# Patient Record
Sex: Male | Born: 1945 | Race: White | Hispanic: No | Marital: Married | Smoking: Former smoker
Health system: Southern US, Community
[De-identification: ages and names within clinical notes are randomized; demographics above are authoritative.]

## PROBLEM LIST (undated history)

## (undated) DIAGNOSIS — I219 Acute myocardial infarction, unspecified: Secondary | ICD-10-CM

## (undated) DIAGNOSIS — M1990 Unspecified osteoarthritis, unspecified site: Secondary | ICD-10-CM

## (undated) DIAGNOSIS — J189 Pneumonia, unspecified organism: Secondary | ICD-10-CM

## (undated) DIAGNOSIS — F419 Anxiety disorder, unspecified: Secondary | ICD-10-CM

## (undated) DIAGNOSIS — M810 Age-related osteoporosis without current pathological fracture: Secondary | ICD-10-CM

## (undated) DIAGNOSIS — K219 Gastro-esophageal reflux disease without esophagitis: Secondary | ICD-10-CM

## (undated) DIAGNOSIS — I10 Essential (primary) hypertension: Secondary | ICD-10-CM

## (undated) DIAGNOSIS — I2510 Atherosclerotic heart disease of native coronary artery without angina pectoris: Secondary | ICD-10-CM

## (undated) HISTORY — DX: Gastro-esophageal reflux disease without esophagitis: K21.9

## (undated) HISTORY — DX: Anxiety disorder, unspecified: F41.9

## (undated) HISTORY — PX: OTHER SURGICAL HISTORY: SHX169

## (undated) HISTORY — PX: CARDIAC SURGERY: SHX584

## (undated) HISTORY — PX: JOINT REPLACEMENT: SHX530

## (undated) HISTORY — PX: CARDIAC CATHETERIZATION: SHX172

## (undated) HISTORY — PX: HIP FRACTURE SURGERY: SHX118

## (undated) HISTORY — DX: Age-related osteoporosis without current pathological fracture: M81.0

---

## 2015-02-21 ENCOUNTER — Encounter (HOSPITAL_COMMUNITY): Payer: Self-pay | Admitting: Cardiology

## 2015-02-21 ENCOUNTER — Emergency Department (HOSPITAL_COMMUNITY)
Admission: EM | Admit: 2015-02-21 | Discharge: 2015-02-21 | Disposition: A | Discharge status: Home / Self Care | Payer: Medicare Other | Attending: Emergency Medicine | Admitting: Emergency Medicine

## 2015-02-21 ENCOUNTER — Emergency Department (HOSPITAL_COMMUNITY): Payer: Medicare Other

## 2015-02-21 DIAGNOSIS — R0789 Other chest pain: Secondary | ICD-10-CM | POA: Diagnosis not present

## 2015-02-21 DIAGNOSIS — R0602 Shortness of breath: Secondary | ICD-10-CM | POA: Diagnosis not present

## 2015-02-21 DIAGNOSIS — Z87891 Personal history of nicotine dependence: Secondary | ICD-10-CM | POA: Insufficient documentation

## 2015-02-21 DIAGNOSIS — R079 Chest pain, unspecified: Secondary | ICD-10-CM | POA: Diagnosis present

## 2015-02-21 DIAGNOSIS — Z79899 Other long term (current) drug therapy: Secondary | ICD-10-CM | POA: Insufficient documentation

## 2015-02-21 DIAGNOSIS — Z8739 Personal history of other diseases of the musculoskeletal system and connective tissue: Secondary | ICD-10-CM | POA: Diagnosis not present

## 2015-02-21 HISTORY — DX: Unspecified osteoarthritis, unspecified site: M19.90

## 2015-02-21 LAB — CBC
HCT: 43.4 % (ref 39.0–52.0)
HEMOGLOBIN: 15 g/dL (ref 13.0–17.0)
MCH: 30.5 pg (ref 26.0–34.0)
MCHC: 34.6 g/dL (ref 30.0–36.0)
MCV: 88.2 fL (ref 78.0–100.0)
Platelets: 201 10*3/uL (ref 150–400)
RBC: 4.92 MIL/uL (ref 4.22–5.81)
RDW: 13.2 % (ref 11.5–15.5)
WBC: 10.2 10*3/uL (ref 4.0–10.5)

## 2015-02-21 LAB — COMPREHENSIVE METABOLIC PANEL
ALBUMIN: 3.6 g/dL (ref 3.5–5.0)
ALK PHOS: 51 U/L (ref 38–126)
ALT: 18 U/L (ref 17–63)
AST: 18 U/L (ref 15–41)
Anion gap: 8 (ref 5–15)
BUN: 19 mg/dL (ref 6–20)
CALCIUM: 8.8 mg/dL — AB (ref 8.9–10.3)
CO2: 26 mmol/L (ref 22–32)
CREATININE: 1.12 mg/dL (ref 0.61–1.24)
Chloride: 105 mmol/L (ref 101–111)
GFR calc Af Amer: >60 mL/min (ref >60)
GFR calc non Af Amer: >60 mL/min (ref >60)
GLUCOSE: 132 mg/dL — AB (ref 65–99)
Potassium: 3.4 mmol/L — ABNORMAL LOW (ref 3.5–5.1)
SODIUM: 139 mmol/L (ref 135–145)
Total Bilirubin: 0.6 mg/dL (ref 0.3–1.2)
Total Protein: 6.8 g/dL (ref 6.5–8.1)

## 2015-02-21 LAB — I-STAT TROPONIN, ED: Troponin i, poc: 0 ng/mL (ref 0.00–0.08)

## 2015-02-21 LAB — PROTIME-INR
INR: 1.03 (ref 0.00–1.49)
Prothrombin Time: 13.7 seconds (ref 11.6–15.2)

## 2015-02-21 LAB — TROPONIN I: Troponin I: <0.03 ng/mL (ref <0.031)

## 2015-02-21 LAB — MAGNESIUM: Magnesium: 1.8 mg/dL (ref 1.7–2.4)

## 2015-02-21 MED ORDER — NITROGLYCERIN 0.4 MG SL SUBL: 0.4000 mg | SUBLINGUAL_TABLET | Freq: Once | SUBLINGUAL | Status: DC

## 2015-02-21 MED ORDER — PREDNISONE 50 MG PO TABS: 50.0000 mg | ORAL_TABLET | Freq: Every day | ORAL | Status: DC

## 2015-02-21 MED ORDER — NITROGLYCERIN 0.4 MG SL SUBL: 0.4000 mg | SUBLINGUAL_TABLET | SUBLINGUAL | Status: DC | PRN

## 2015-02-21 MED ORDER — ASPIRIN 81 MG PO CHEW
324.0000 mg | CHEWABLE_TABLET | Freq: Once | ORAL | Status: AC
  Administered 2015-02-21: 324 mg via ORAL
  Filled 2015-02-21: qty 4

## 2015-02-21 MED ORDER — NITROGLYCERIN 0.4 MG SL SUBL
0.4000 mg | SUBLINGUAL_TABLET | SUBLINGUAL | Status: DC | PRN
  Administered 2015-02-21 (×3): 0.4 mg via SUBLINGUAL

## 2015-02-21 NOTE — ED Notes (Signed)
No ASA in last 24 H.

## 2015-02-21 NOTE — ED Notes (Signed)
Pt reports pain has returned.

## 2015-02-21 NOTE — ED Notes (Signed)
Pt. returned from XR. 

## 2015-02-21 NOTE — ED Notes (Signed)
Pt report pain has subsided.

## 2015-02-21 NOTE — ED Notes (Signed)
Chest pain since last night.   

## 2015-02-21 NOTE — ED Provider Notes (Signed)
CSN: [id number]     Arrival date & time 02/21/15  1311 History   First MD Initiated Contact with Patient 02/21/15 1317     Chief Complaint  Patient presents with  . Chest Pain   HPI  Mr. Yabut is a 69yo male presenting today for left chest pain since last night. Reports pain is constant and describes as a dull ache. Pain not affected by touch, movement, or breathing. Pain was worse when sleeping no his right side last night. Denies palpitations, but states it feels like his heart is beating fast. Has also noted shortness of breath over the last two weeks. Quit smoking two weeks ago. Denies worsening breathing or orthopnea. Notes paroxysmal nocturnal dyspnea twice over the last two weeks. Denies cough, fever, congestion. Reports cough a few weeks ago, but this has resolved. No abnormal activities prior to pain, stating it was right after he had eaten supper and has been constant since that time. Has not tried any medications for this pain. Has had increased stress over the last week with his wife recently int he ICU following a fall for head bleed. History of hypertension and chronic back and knee pain.   Past Medical History  Diagnosis Date  . Arthritis    History reviewed. No pertinent past surgical history. History reviewed. No pertinent family history. Social History  Substance Use Topics  . Smoking status: Former Smoker    Quit date: 02/07/2015  . Smokeless tobacco: None  . Alcohol Use: No    Review of Systems  Constitutional: Negative for fever.  Respiratory: Positive for shortness of breath.   Cardiovascular: Positive for chest pain. Negative for palpitations and leg swelling.  Gastrointestinal: Negative for abdominal pain.      Allergies  Review of patient's allergies indicates no active allergies.  Home Medications   Prior to Admission medications   Medication Sig Start Date End Date Taking? Authorizing Provider  acetaminophen (TYLENOL) 500 MG tablet Take 1,000 mg by  mouth daily as needed for moderate pain.   Yes Historical Provider, MD  clobetasol cream (TEMOVATE) 0.05 % Apply 1 application topically 2 (two) times daily.   Yes Historical Provider, MD  hydrochlorothiazide (HYDRODIURIL) 25 MG tablet Take 25 mg by mouth daily.   Yes Historical Provider, MD  HYDROcodone-acetaminophen (NORCO) 10-325 MG per tablet Take 0.5-1 tablets by mouth 2 (two) times daily as needed for moderate pain.   Yes Historical Provider, MD  naproxen sodium (ANAPROX) 220 MG tablet Take 440 mg by mouth daily as needed (pain).   Yes Historical Provider, MD  Omega-3 Fatty Acids (FISH OIL PO) Take 2-3 capsules by mouth daily.   Yes Historical Provider, MD  OVER THE COUNTER MEDICATION Apply 1 application topically 2 (two) times daily. Vitamin D cream for psoriasis.   Yes Historical Provider, MD  terazosin (HYTRIN) 5 MG capsule Take 5 mg by mouth at bedtime.   Yes Historical Provider, MD  nitroGLYCERIN (NITROSTAT) 0.4 MG SL tablet Place 1 tablet (0.4 mg total) under the tongue every 5 (five) minutes as needed for chest pain. 02/21/15   [place] N Tulio Facundo, DO  predniSONE (DELTASONE) 50 MG tablet Take 1 tablet (50 mg total) by mouth daily with breakfast. 02/21/15    N Alia Parsley, DO   BP 127/80 mmHg  Pulse 79  Resp 12  SpO2 94% Physical Exam  Constitutional: He appears well-developed and well-nourished. No distress.  HENT:  Head: Normocephalic and atraumatic.  Cardiovascular: Normal rate and regular rhythm.  Exam  reveals no gallop and no friction rub.   No murmur heard. Pulmonary/Chest: Effort normal. No respiratory distress. He has no wheezes. He has no rales.  Abdominal: Soft. Bowel sounds are normal. He exhibits no distension. There is no tenderness.  Musculoskeletal: He exhibits no edema.  Skin: Skin is warm and dry. No rash noted.  Psychiatric: He has a normal mood and affect. His behavior is normal.    ED Course  Procedures (including critical care time) Labs Review Labs  Reviewed  COMPREHENSIVE METABOLIC PANEL - Abnormal; Notable for the following:    Potassium 3.4 (*)    Glucose, Bld 132 (*)    Calcium 8.8 (*)    All other components within normal limits  CBC  MAGNESIUM  TROPONIN I  PROTIME-INR  I-STAT TROPOININ, ED    Imaging Review Dg Chest 2 View  02/21/2015   CLINICAL DATA:  Left chest pain since last night. Shortness of breath. History of smoking and recently quit.  EXAM: CHEST  2 VIEW  COMPARISON:  None.  FINDINGS: Subtle densities at the left lung base. Otherwise, the lungs are clear. Heart size is normal. The trachea is midline. Mild degenerative changes in the thoracic spine.  IMPRESSION: Subtle densities at left lung base. Findings could represent atelectasis or small focus of infection. Recommend follow-up to ensure resolution.   Electronically Signed   By: Richarda Overlie M.D.   On: 02/21/2015 14:07   I have personally reviewed and evaluated these images and lab results as part of my medical decision-making.   EKG Interpretation   Date/Time:  Wednesday February 21 2015 13:16:48 EDT Ventricular Rate:  93 PR Interval:  197 QRS Duration: 154 QT Interval:  355 QTC Calculation: 441 R Axis:   44 Text Interpretation:  Sinus rhythm Left bundle branch block Sinus rhythm  Left bundle branch block Abnormal ekg Confirmed by Gerhard Munch  MD  ([id number]) on 02/21/2015 1:23:45 PM      MDM   Final diagnoses:  Other chest pain  i-stat troponin 0 and troponin negative. CBC normal. CMP with mild hypokalemia of 3.4 and hyperglycemia of 132. EKG with LBBB without an old EKG available for comparison. CXR with subtle densities in left lung base, representing atelectasis vs. small focus of infection; consider follow up by PCP for resolution. Given no fevers noted and history of cough a few weeks ago that recently resolved, suspect is the remaining evidence of infection that is now resolving.  Chest pain resolved with dose of Nitro SL. Stable for discharge with  close follow up with Cardiology. Stressed that Cardiology follow up as soon as possible is important. Prescription for Prednisone burst and Nitro SL PRN given.    [address] [place], [place] 02/21/15 1516  Gerhard Munch, MD 02/25/15 [phone]

## 2015-02-21 NOTE — Discharge Instructions (Signed)
Your chest xray showed a very small area that may have resolving pneumonia. I suspect that this is from the cough you had a few weeks ago...sometimes the chest xray will still show infection even though you are feeling better. I will give you a short course of steroids to help with your breathing. Please follow up with a Cardiologist so your chest pain can be further evaluated as an outpatient. They may need to do further testing, however are tests here show you are not currently having a heart attack. I will give you a few tablets of Nitroglycerin. If your chest pain recurs and does not resolve after taking two Nitroglycerin tablets, please return to the ED for further evaluation.   Chest Pain (Nonspecific) It is often hard to give a specific diagnosis for the cause of chest pain. There is always a chance that your pain could be related to something serious, such as a heart attack or a blood clot in the lungs. You need to follow up with your health care provider for further evaluation. CAUSES   Heartburn.  Pneumonia or bronchitis.  Anxiety or stress.  Inflammation around your heart (pericarditis) or lung (pleuritis or pleurisy).  A blood clot in the lung.  A collapsed lung (pneumothorax). It can develop suddenly on its own (spontaneous pneumothorax) or from trauma to the chest.  Shingles infection (herpes zoster virus). The chest wall is composed of bones, muscles, and cartilage. Any of these can be the source of the pain.  The bones can be bruised by injury.  The muscles or cartilage can be strained by coughing or overwork.  The cartilage can be affected by inflammation and become sore (costochondritis). DIAGNOSIS  Lab tests or other studies may be needed to find the cause of your pain. Your health care provider may have you take a test called an ambulatory electrocardiogram (ECG). An ECG records your heartbeat patterns over a 24-hour period. You may also have other tests, such  as:  Transthoracic echocardiogram (TTE). During echocardiography, sound waves are used to evaluate how blood flows through your heart.  Transesophageal echocardiogram (TEE).  Cardiac monitoring. This allows your health care provider to monitor your heart rate and rhythm in real time.  Holter monitor. This is a portable device that records your heartbeat and can help diagnose heart arrhythmias. It allows your health care provider to track your heart activity for several days, if needed.  Stress tests by exercise or by giving medicine that makes the heart beat faster. TREATMENT   Treatment depends on what may be causing your chest pain. Treatment may include:  Acid blockers for heartburn.  Anti-inflammatory medicine.  Pain medicine for inflammatory conditions.  Antibiotics if an infection is present.  You may be advised to change lifestyle habits. This includes stopping smoking and avoiding alcohol, caffeine, and chocolate.  You may be advised to keep your head raised (elevated) when sleeping. This reduces the chance of acid going backward from your stomach into your esophagus. Most of the time, nonspecific chest pain will improve within 2-3 days with rest and mild pain medicine.  HOME CARE INSTRUCTIONS   If antibiotics were prescribed, take them as directed. Finish them even if you start to feel better.  For the next few days, avoid physical activities that bring on chest pain. Continue physical activities as directed.  Do not use any tobacco products, including cigarettes, chewing tobacco, or electronic cigarettes.  Avoid drinking alcohol.  Only take medicine as directed by your health  care provider.  Follow your health care provider's suggestions for further testing if your chest pain does not go away.  Keep any follow-up appointments you made. If you do not go to an appointment, you could develop lasting (chronic) problems with pain. If there is any problem keeping an  appointment, call to reschedule. SEEK MEDICAL CARE IF:   Your chest pain does not go away, even after treatment.  You have a rash with blisters on your chest.  You have a fever. SEEK IMMEDIATE MEDICAL CARE IF:   You have increased chest pain or pain that spreads to your arm, neck, jaw, back, or abdomen.  You have shortness of breath.  You have an increasing cough, or you cough up blood.  You have severe back or abdominal pain.  You feel nauseous or vomit.  You have severe weakness.  You faint.  You have chills. This is an emergency. Do not wait to see if the pain will go away. Get medical help at once. Call your local emergency services (911 in [place] Do not drive yourself to the hospital. MAKE SURE YOU:   Understand these instructions.  Will watch your condition.  Will get help right away if you are not doing well or get worse. Document Released: 03/26/2005 Document Revised: 06/21/2013 Document Reviewed: 01/20/2008 [facility] Patient Information 2015 [place], [place]. This information is not intended to replace advice given to you by your health care provider. Make sure you discuss any questions you have with your health care provider.

## 2015-04-27 HISTORY — PX: CORONARY ANGIOPLASTY: SHX604

## 2016-08-07 IMAGING — DX DG CHEST 2V
2 series · 2 of 2 positions shown · non-contrast
Comparison: None.

CLINICAL DATA: Left chest pain since last night. Shortness of
breath. History of smoking and recently quit.

EXAM:
CHEST  2 VIEW

[chest pa]
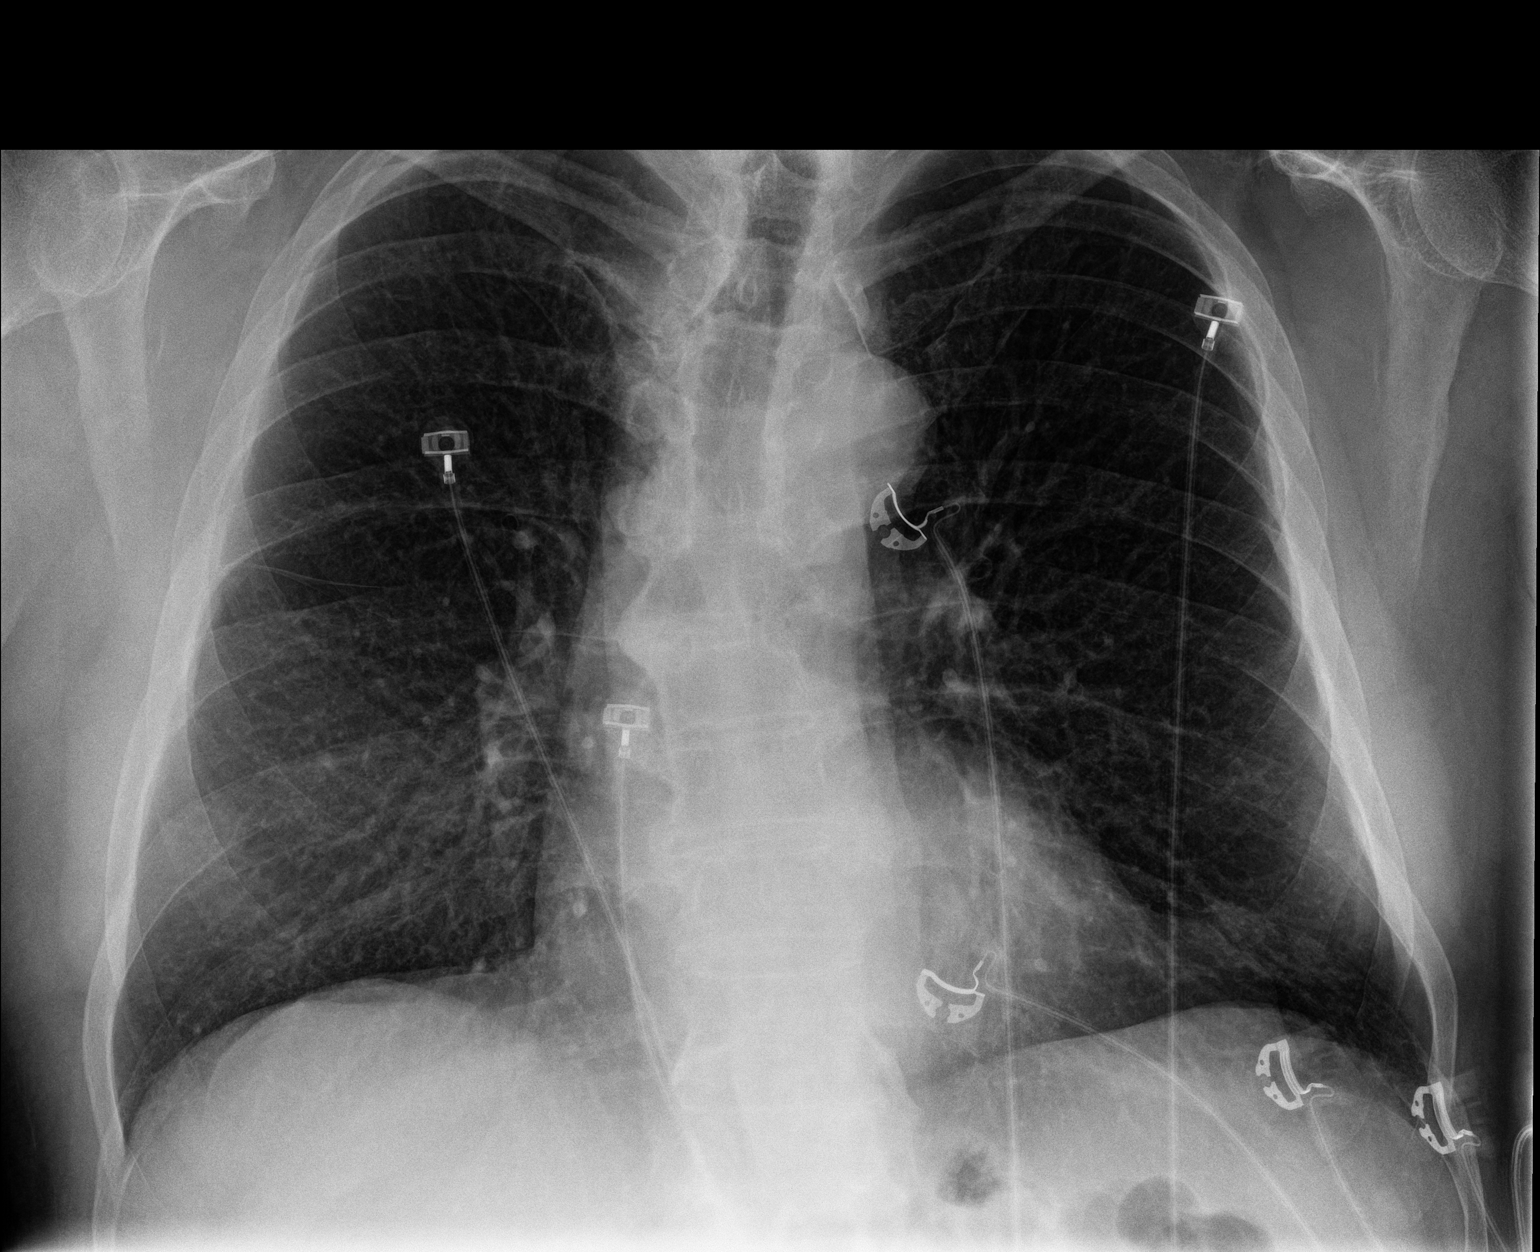

[chest lat]
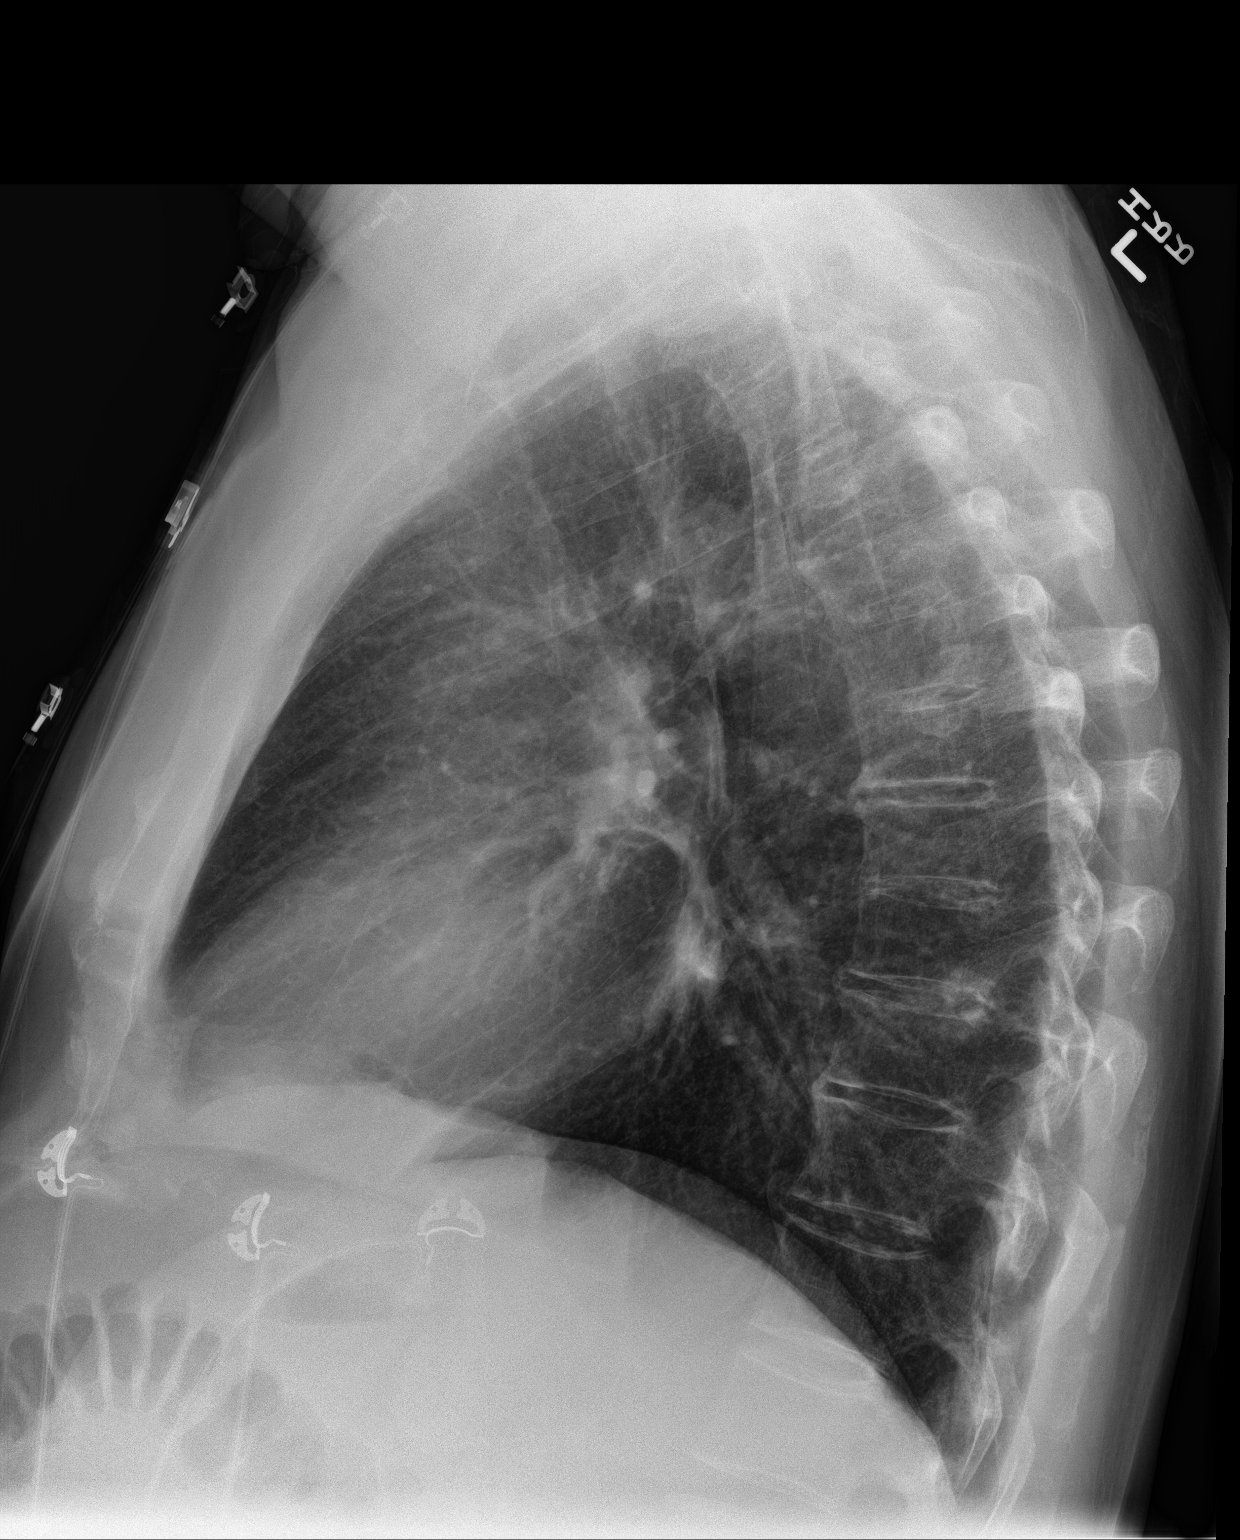

[2 of 2 positions shown; findings below may reference images not displayed]

FINDINGS: Subtle densities at the left lung base. Otherwise, the lungs are
clear. Heart size is normal. The trachea is midline. Mild
degenerative changes in the thoracic spine.
IMPRESSION: Subtle densities at left lung base. Findings could represent
atelectasis or small focus of infection. Recommend follow-up to
ensure resolution.

## 2018-08-10 DIAGNOSIS — Z125 Encounter for screening for malignant neoplasm of prostate: Secondary | ICD-10-CM | POA: Diagnosis not present

## 2018-08-10 DIAGNOSIS — E7800 Pure hypercholesterolemia, unspecified: Secondary | ICD-10-CM | POA: Diagnosis not present

## 2018-08-10 DIAGNOSIS — Z6827 Body mass index (BMI) 27.0-27.9, adult: Secondary | ICD-10-CM | POA: Diagnosis not present

## 2018-08-10 DIAGNOSIS — Z1211 Encounter for screening for malignant neoplasm of colon: Secondary | ICD-10-CM | POA: Diagnosis not present

## 2018-08-10 DIAGNOSIS — Z1339 Encounter for screening examination for other mental health and behavioral disorders: Secondary | ICD-10-CM | POA: Diagnosis not present

## 2018-08-10 DIAGNOSIS — Z0000 Encounter for general adult medical examination without abnormal findings: Secondary | ICD-10-CM | POA: Diagnosis not present

## 2018-08-10 DIAGNOSIS — Z7189 Other specified counseling: Secondary | ICD-10-CM | POA: Diagnosis not present

## 2018-08-10 DIAGNOSIS — Z79899 Other long term (current) drug therapy: Secondary | ICD-10-CM | POA: Diagnosis not present

## 2018-08-10 DIAGNOSIS — R5383 Other fatigue: Secondary | ICD-10-CM | POA: Diagnosis not present

## 2018-08-10 DIAGNOSIS — Z1331 Encounter for screening for depression: Secondary | ICD-10-CM | POA: Diagnosis not present

## 2018-08-10 DIAGNOSIS — I10 Essential (primary) hypertension: Secondary | ICD-10-CM | POA: Diagnosis not present

## 2019-01-03 DIAGNOSIS — S46012D Strain of muscle(s) and tendon(s) of the rotator cuff of left shoulder, subsequent encounter: Secondary | ICD-10-CM | POA: Diagnosis not present

## 2019-01-03 DIAGNOSIS — M25511 Pain in right shoulder: Secondary | ICD-10-CM | POA: Diagnosis not present

## 2019-01-03 DIAGNOSIS — M170 Bilateral primary osteoarthritis of knee: Secondary | ICD-10-CM | POA: Diagnosis not present

## 2019-01-06 DIAGNOSIS — S46212A Strain of muscle, fascia and tendon of other parts of biceps, left arm, initial encounter: Secondary | ICD-10-CM | POA: Diagnosis not present

## 2019-01-06 DIAGNOSIS — M7592 Shoulder lesion, unspecified, left shoulder: Secondary | ICD-10-CM | POA: Diagnosis not present

## 2019-01-06 DIAGNOSIS — M25512 Pain in left shoulder: Secondary | ICD-10-CM | POA: Diagnosis not present

## 2019-01-21 DIAGNOSIS — M170 Bilateral primary osteoarthritis of knee: Secondary | ICD-10-CM | POA: Diagnosis not present

## 2019-01-21 DIAGNOSIS — M25512 Pain in left shoulder: Secondary | ICD-10-CM | POA: Diagnosis not present

## 2019-01-21 DIAGNOSIS — S46012D Strain of muscle(s) and tendon(s) of the rotator cuff of left shoulder, subsequent encounter: Secondary | ICD-10-CM | POA: Diagnosis not present

## 2019-02-09 DIAGNOSIS — I10 Essential (primary) hypertension: Secondary | ICD-10-CM | POA: Diagnosis not present

## 2019-02-09 DIAGNOSIS — Z87891 Personal history of nicotine dependence: Secondary | ICD-10-CM | POA: Diagnosis not present

## 2019-02-09 DIAGNOSIS — Z299 Encounter for prophylactic measures, unspecified: Secondary | ICD-10-CM | POA: Diagnosis not present

## 2019-02-09 DIAGNOSIS — Z6826 Body mass index (BMI) 26.0-26.9, adult: Secondary | ICD-10-CM | POA: Diagnosis not present

## 2019-02-09 DIAGNOSIS — I2510 Atherosclerotic heart disease of native coronary artery without angina pectoris: Secondary | ICD-10-CM | POA: Diagnosis not present

## 2019-04-20 DIAGNOSIS — Z6832 Body mass index (BMI) 32.0-32.9, adult: Secondary | ICD-10-CM | POA: Diagnosis not present

## 2019-04-20 DIAGNOSIS — I2510 Atherosclerotic heart disease of native coronary artery without angina pectoris: Secondary | ICD-10-CM | POA: Diagnosis not present

## 2019-04-20 DIAGNOSIS — I10 Essential (primary) hypertension: Secondary | ICD-10-CM | POA: Diagnosis not present

## 2019-04-20 DIAGNOSIS — E7800 Pure hypercholesterolemia, unspecified: Secondary | ICD-10-CM | POA: Diagnosis not present

## 2019-04-20 DIAGNOSIS — Z299 Encounter for prophylactic measures, unspecified: Secondary | ICD-10-CM | POA: Diagnosis not present

## 2019-04-21 DIAGNOSIS — I10 Essential (primary) hypertension: Secondary | ICD-10-CM | POA: Diagnosis not present

## 2019-06-09 DIAGNOSIS — Z87891 Personal history of nicotine dependence: Secondary | ICD-10-CM | POA: Diagnosis not present

## 2019-06-09 DIAGNOSIS — Z713 Dietary counseling and surveillance: Secondary | ICD-10-CM | POA: Diagnosis not present

## 2019-06-09 DIAGNOSIS — J029 Acute pharyngitis, unspecified: Secondary | ICD-10-CM | POA: Diagnosis not present

## 2019-06-09 DIAGNOSIS — Z6830 Body mass index (BMI) 30.0-30.9, adult: Secondary | ICD-10-CM | POA: Diagnosis not present

## 2019-06-09 DIAGNOSIS — Z299 Encounter for prophylactic measures, unspecified: Secondary | ICD-10-CM | POA: Diagnosis not present

## 2019-06-09 DIAGNOSIS — I2510 Atherosclerotic heart disease of native coronary artery without angina pectoris: Secondary | ICD-10-CM | POA: Diagnosis not present

## 2019-08-01 DIAGNOSIS — M170 Bilateral primary osteoarthritis of knee: Secondary | ICD-10-CM | POA: Diagnosis not present

## 2019-08-17 DIAGNOSIS — Z1339 Encounter for screening examination for other mental health and behavioral disorders: Secondary | ICD-10-CM | POA: Diagnosis not present

## 2019-08-17 DIAGNOSIS — I10 Essential (primary) hypertension: Secondary | ICD-10-CM | POA: Diagnosis not present

## 2019-08-17 DIAGNOSIS — Z6828 Body mass index (BMI) 28.0-28.9, adult: Secondary | ICD-10-CM | POA: Diagnosis not present

## 2019-08-17 DIAGNOSIS — E7800 Pure hypercholesterolemia, unspecified: Secondary | ICD-10-CM | POA: Diagnosis not present

## 2019-08-17 DIAGNOSIS — Z7189 Other specified counseling: Secondary | ICD-10-CM | POA: Diagnosis not present

## 2019-08-17 DIAGNOSIS — Z1211 Encounter for screening for malignant neoplasm of colon: Secondary | ICD-10-CM | POA: Diagnosis not present

## 2019-08-17 DIAGNOSIS — Z299 Encounter for prophylactic measures, unspecified: Secondary | ICD-10-CM | POA: Diagnosis not present

## 2019-08-17 DIAGNOSIS — Z1331 Encounter for screening for depression: Secondary | ICD-10-CM | POA: Diagnosis not present

## 2019-08-17 DIAGNOSIS — Z0000 Encounter for general adult medical examination without abnormal findings: Secondary | ICD-10-CM | POA: Diagnosis not present

## 2019-08-31 DIAGNOSIS — Z79899 Other long term (current) drug therapy: Secondary | ICD-10-CM | POA: Diagnosis not present

## 2019-08-31 DIAGNOSIS — R5383 Other fatigue: Secondary | ICD-10-CM | POA: Diagnosis not present

## 2019-08-31 DIAGNOSIS — E7800 Pure hypercholesterolemia, unspecified: Secondary | ICD-10-CM | POA: Diagnosis not present

## 2019-08-31 DIAGNOSIS — Z125 Encounter for screening for malignant neoplasm of prostate: Secondary | ICD-10-CM | POA: Diagnosis not present

## 2019-10-31 DIAGNOSIS — M170 Bilateral primary osteoarthritis of knee: Secondary | ICD-10-CM | POA: Diagnosis not present

## 2020-02-06 DIAGNOSIS — M170 Bilateral primary osteoarthritis of knee: Secondary | ICD-10-CM | POA: Diagnosis not present

## 2020-05-07 DIAGNOSIS — M170 Bilateral primary osteoarthritis of knee: Secondary | ICD-10-CM | POA: Diagnosis not present

## 2020-07-30 DIAGNOSIS — M170 Bilateral primary osteoarthritis of knee: Secondary | ICD-10-CM | POA: Diagnosis not present

## 2020-10-29 DIAGNOSIS — M170 Bilateral primary osteoarthritis of knee: Secondary | ICD-10-CM | POA: Diagnosis not present

## 2021-01-28 DIAGNOSIS — M170 Bilateral primary osteoarthritis of knee: Secondary | ICD-10-CM | POA: Diagnosis not present

## 2021-02-15 DIAGNOSIS — Z7189 Other specified counseling: Secondary | ICD-10-CM | POA: Diagnosis not present

## 2021-02-15 DIAGNOSIS — E7800 Pure hypercholesterolemia, unspecified: Secondary | ICD-10-CM | POA: Diagnosis not present

## 2021-02-15 DIAGNOSIS — Z79899 Other long term (current) drug therapy: Secondary | ICD-10-CM | POA: Diagnosis not present

## 2021-02-15 DIAGNOSIS — R5383 Other fatigue: Secondary | ICD-10-CM | POA: Diagnosis not present

## 2021-02-15 DIAGNOSIS — Z1339 Encounter for screening examination for other mental health and behavioral disorders: Secondary | ICD-10-CM | POA: Diagnosis not present

## 2021-02-15 DIAGNOSIS — I2510 Atherosclerotic heart disease of native coronary artery without angina pectoris: Secondary | ICD-10-CM | POA: Diagnosis not present

## 2021-02-15 DIAGNOSIS — Z1331 Encounter for screening for depression: Secondary | ICD-10-CM | POA: Diagnosis not present

## 2021-02-15 DIAGNOSIS — Z6828 Body mass index (BMI) 28.0-28.9, adult: Secondary | ICD-10-CM | POA: Diagnosis not present

## 2021-02-15 DIAGNOSIS — Z0000 Encounter for general adult medical examination without abnormal findings: Secondary | ICD-10-CM | POA: Diagnosis not present

## 2021-04-22 DIAGNOSIS — M170 Bilateral primary osteoarthritis of knee: Secondary | ICD-10-CM | POA: Diagnosis not present

## 2021-05-16 ENCOUNTER — Ambulatory Visit (INDEPENDENT_AMBULATORY_CARE_PROVIDER_SITE_OTHER): Payer: No Typology Code available for payment source | Admitting: Orthopaedic Surgery

## 2021-05-16 ENCOUNTER — Encounter: Payer: Self-pay | Admitting: Orthopaedic Surgery

## 2021-05-16 ENCOUNTER — Other Ambulatory Visit: Payer: Self-pay

## 2021-05-16 DIAGNOSIS — M1712 Unilateral primary osteoarthritis, left knee: Secondary | ICD-10-CM

## 2021-05-16 NOTE — Progress Notes (Signed)
Office Visit Note   Patient: Thomas [name]           Date of Birth: Mar 18, 1946           MRN: [id number] Visit Date: 05/16/2021              Requested by: No referring provider defined for this encounter. PCP: Kirstie Peri, MD   Assessment & Plan: Visit Diagnoses:  1. Unilateral primary osteoarthritis, left knee     Plan: Patient has bone-on-bone changes left knee.  We discussed total knee arthroplasty.  He also has severe right knee osteoarthritis would like to proceed with left total knee arthroplasty.  We discussed usual overnight stay, home health physical therapy/outpatient physical therapy.  Risks of surgery discussed preoperative block, spinal anesthesia, Marcaine, Exparel to help with postop pain.  Questions elicited and answered he understands request to proceed.  Follow-Up Instructions: No follow-ups on file.   Orders:  No orders of the defined types were placed in this encounter.  No orders of the defined types were placed in this encounter.     Procedures: No procedures performed   Clinical Data: No additional findings.   Subjective: Chief Complaint  Patient presents with   Right Knee - Pain   Left Knee - Pain    HPI 75 year old male seen with bilateral knee pain.  Patient's had chronic pain for greater than 20 years right and left knee equal type symptoms.  He states pain is constant occasional swelling occasional locking popping and giving way.  No previous surgeries.  He has had previous cortisone injections.  They do help some last injection was October 2022.  Patient's been taking hydrocodone with some relief.  Patient is on chronic hydrocodone 10/325 90 tablets monthly.  Knee x-rays 05/06/2021 showed bone-on-bone changes medial compartment both knees with marginal osteophytes tricompartmental degenerative changes.  Right knee some medial subluxation femur on tibia.  Review of Systems no history of acid reflux anxiety bladder problems Heart disease  osteoporosis pneumonia.  Previous cardiac stents placed at [facility] 04/27/2015.  Patient is retired quit smoking last year.  Possible high cholesterol hypertension.   Objective: Vital Signs: Ht 5\' 8"  (1.727 m)   Wt 184 lb (83.5 kg)   BMI 27.98 kg/m   Physical Exam Constitutional:      Appearance: He is well-developed.  HENT:     Head: Normocephalic and atraumatic.     Right Ear: External ear normal.     Left Ear: External ear normal.  Eyes:     Pupils: Pupils are equal, round, and reactive to light.  Neck:     Thyroid: No thyromegaly.     Trachea: No tracheal deviation.  Cardiovascular:     Rate and Rhythm: Normal rate.  Pulmonary:     Effort: Pulmonary effort is normal.     Breath sounds: No wheezing.  Abdominal:     General: Bowel sounds are normal.     Palpations: Abdomen is soft.  Musculoskeletal:     Cervical back: Neck supple.  Skin:    General: Skin is warm and dry.     Capillary Refill: Capillary refill takes less than 2 seconds.  Neurological:     Mental Status: He is alert and oriented to person, place, and time.  Psychiatric:        Behavior: Behavior normal.        Thought Content: Thought content normal.        Judgment: Judgment normal.  Ortho Exam bilateral knee crepitus.  Both knees reach full extension.  Flexion to 100 degrees right and left.  Distal pulses palpable.  Negative logroll of the hips.  Specialty Comments:  No specialty comments available.  Imaging: See above x-ray results from 05/06/2021.   PMFS History: Patient Active Problem List   Diagnosis Date Noted   Unilateral primary osteoarthritis, left knee 05/19/2021   Past Medical History:  Diagnosis Date   Acid reflux    Anxiety    Arthritis    Osteoporosis     No family history on file.  Past Surgical History:  Procedure Laterality Date   CARDIAC SURGERY     Social History   Occupational History   Not on file  Tobacco Use   Smoking status: Former    Types:  Cigarettes    Quit date: 02/07/2015    Years since quitting: 6.2   Smokeless tobacco: Not on file  Substance and Sexual Activity   Alcohol use: No   Drug use: No   Sexual activity: Not on file

## 2021-05-19 DIAGNOSIS — M1712 Unilateral primary osteoarthritis, left knee: Secondary | ICD-10-CM | POA: Insufficient documentation

## 2021-05-20 DIAGNOSIS — M1710 Unilateral primary osteoarthritis, unspecified knee: Secondary | ICD-10-CM | POA: Diagnosis not present

## 2021-05-20 DIAGNOSIS — B354 Tinea corporis: Secondary | ICD-10-CM | POA: Diagnosis not present

## 2021-05-20 DIAGNOSIS — Z299 Encounter for prophylactic measures, unspecified: Secondary | ICD-10-CM | POA: Diagnosis not present

## 2021-05-20 DIAGNOSIS — I10 Essential (primary) hypertension: Secondary | ICD-10-CM | POA: Diagnosis not present

## 2021-05-20 DIAGNOSIS — L409 Psoriasis, unspecified: Secondary | ICD-10-CM | POA: Diagnosis not present

## 2021-06-12 DIAGNOSIS — M1710 Unilateral primary osteoarthritis, unspecified knee: Secondary | ICD-10-CM | POA: Diagnosis not present

## 2021-06-12 DIAGNOSIS — Z299 Encounter for prophylactic measures, unspecified: Secondary | ICD-10-CM | POA: Diagnosis not present

## 2021-06-12 DIAGNOSIS — I10 Essential (primary) hypertension: Secondary | ICD-10-CM | POA: Diagnosis not present

## 2021-06-12 DIAGNOSIS — I25118 Atherosclerotic heart disease of native coronary artery with other forms of angina pectoris: Secondary | ICD-10-CM | POA: Diagnosis not present

## 2021-06-27 ENCOUNTER — Other Ambulatory Visit: Payer: Self-pay

## 2021-07-18 ENCOUNTER — Other Ambulatory Visit: Payer: Self-pay

## 2021-07-18 ENCOUNTER — Ambulatory Visit (INDEPENDENT_AMBULATORY_CARE_PROVIDER_SITE_OTHER): Payer: No Typology Code available for payment source | Admitting: Surgery

## 2021-07-18 ENCOUNTER — Encounter: Payer: Self-pay | Admitting: Surgery

## 2021-07-18 VITALS — BP 131/73 | HR 64 | Ht 68.0 in | Wt 184.0 lb

## 2021-07-18 DIAGNOSIS — M1712 Unilateral primary osteoarthritis, left knee: Secondary | ICD-10-CM

## 2021-07-19 ENCOUNTER — Telehealth: Payer: Self-pay | Admitting: Radiology

## 2021-07-19 NOTE — Telephone Encounter (Signed)
[facility] referral faxed to [phone] to obtain authorization for HHPT post total knee surgery on 07/29/2021.

## 2021-07-24 NOTE — Progress Notes (Signed)
Surgical Instructions    Your procedure is scheduled on Monday, January 30th.  Report to [facility] Main Entrance "A" at 8:40 A.M., then check in with the Admitting office.  Call this number if you have problems the morning of surgery:  [phone]   If you have any questions prior to your surgery date call [phone]: Open Monday-Friday 8am-4pm    Remember:  Do not eat after midnight the night before your surgery  You may drink clear liquids until 7:40 AM the morning of your surgery.   Clear liquids allowed are: Water, Non-Citrus Juices (without pulp), Carbonated Beverages, Clear Tea, Black Coffee ONLY (NO MILK, CREAM OR POWDERED CREAMER of any kind), and Gatorade  Please complete your PRE-SURGERY ENSURE that was provided to you by 7:40 AM the morning of surgery.  Please, if able, drink it in one sitting. DO NOT SIP.  Nothing else to drink once you finish the Ensure.     Take these medicines the morning of surgery with A SIP OF WATER Atorvastatin (Lipitor) Metoprolol Tartrate (Lopressor) Minocycline (Minocin) Omeprazole (Prilosec)  If needed: Tylenol  Hydrocodone-Acetaminophen (Norco)  Follow your surgeon's instructions on when to stop Aspirin.  If no instructions were given by your surgeon then you will need to call the office to get those instructions.    As of today, STOP taking Aleve, Naproxen, Ibuprofen, Motrin, Advil, Goody's, BC's, all herbal medications, fish oil, and all vitamins.  After your COVID test   You are not required to quarantine however you are required to wear a well-fitting mask when you are out and around people not in your household.  If your mask becomes wet or soiled, replace with a new one.  Wash your hands often with soap and water for 20 seconds or clean your hands with an alcohol-based hand sanitizer that contains at least 60% alcohol.  Do not share personal items.  Notify your provider: if you are in close contact with someone who has  COVID  or if you develop a fever of 100.4 or greater, sneezing, cough, sore throat, shortness of breath or body aches.           DAY OF SURGERY: Do not wear jewelry  Do not wear lotions, powders, colognes, or deodorant. Men may shave face and neck. Do not bring valuables to the hospital.             [facility] is not responsible for any belongings or valuables.  Do NOT Smoke (Tobacco/Vaping)  24 hours prior to your procedure  If you use a CPAP at night, you may bring your mask for your overnight stay.   Contacts, glasses, hearing aids, dentures or partials may not be worn into surgery, please bring cases for these belongings   For patients admitted to the hospital, discharge time will be determined by your treatment team.   Patients discharged the day of surgery will not be allowed to drive home, and someone needs to stay with them for 24 hours.  NO VISITORS WILL BE ALLOWED IN PRE-OP WHERE PATIENTS ARE PREPPED FOR SURGERY.  ONLY 1 SUPPORT PERSON MAY BE PRESENT IN THE WAITING ROOM WHILE YOU ARE IN SURGERY.  IF YOU ARE TO BE ADMITTED, ONCE YOU ARE IN YOUR ROOM YOU WILL BE ALLOWED TWO (2) VISITORS. 1 (ONE) VISITOR MAY STAY OVERNIGHT BUT MUST ARRIVE TO THE ROOM BY 8pm.  Minor children may have two parents present. Special consideration for safety and communication needs will be reviewed on a  case by case basis.  Special instructions:    Oral Hygiene is also important to reduce your risk of infection.  Remember - BRUSH YOUR TEETH THE MORNING OF SURGERY WITH YOUR REGULAR TOOTHPASTE   [facility]- Preparing For Surgery  Before surgery, you can play an important role. Because skin is not sterile, your skin needs to be as free of germs as possible. You can reduce the number of germs on your skin by washing with CHG (chlorahexidine gluconate) Soap before surgery.  CHG is an antiseptic cleaner which kills germs and bonds with the skin to continue killing germs even after washing.     Please  do not use if you have an allergy to CHG or antibacterial soaps. If your skin becomes reddened/irritated stop using the CHG.  Do not shave (including legs and underarms) for at least 48 hours prior to first CHG shower. It is OK to shave your face.  Please follow these instructions carefully.     Shower the NIGHT BEFORE SURGERY and the MORNING OF SURGERY with CHG Soap.   If you chose to wash your hair, wash your hair first as usual with your normal shampoo. After you shampoo, rinse your hair and body thoroughly to remove the shampoo.  Then [employer] and genitals (private parts) with your normal soap and rinse thoroughly to remove soap.  After that Use CHG Soap as you would any other liquid soap. You can apply CHG directly to the skin and wash gently with a scrungie or a clean washcloth.   Apply the CHG Soap to your body ONLY FROM THE NECK DOWN.  Do not use on open wounds or open sores. Avoid contact with your eyes, ears, mouth and genitals (private parts). Wash Face and genitals (private parts)  with your normal soap.   Wash thoroughly, paying special attention to the area where your surgery will be performed.  Thoroughly rinse your body with warm water from the neck down.  DO NOT shower/wash with your normal soap after using and rinsing off the CHG Soap.  Pat yourself dry with a CLEAN TOWEL.  Wear CLEAN PAJAMAS to bed the night before surgery  Place CLEAN SHEETS on your bed the night before your surgery  DO NOT SLEEP WITH PETS.   Day of Surgery:  Take a shower with CHG soap. Wear Clean/Comfortable clothing the morning of surgery Do not apply any deodorants/lotions.   Remember to brush your teeth WITH YOUR REGULAR TOOTHPASTE.   Please read over the following fact sheets that you were given.

## 2021-07-25 ENCOUNTER — Encounter (HOSPITAL_COMMUNITY)
Admission: RE | Admit: 2021-07-25 | Discharge: 2021-07-25 | Disposition: A | Discharge status: Home / Self Care | Payer: No Typology Code available for payment source | Source: Ambulatory Visit | Attending: Orthopaedic Surgery | Admitting: Orthopaedic Surgery

## 2021-07-25 ENCOUNTER — Other Ambulatory Visit: Payer: Self-pay

## 2021-07-25 ENCOUNTER — Encounter (HOSPITAL_COMMUNITY): Payer: Self-pay

## 2021-07-25 ENCOUNTER — Encounter (HOSPITAL_COMMUNITY)
Admission: RE | Admit: 2021-07-25 | Discharge: 2021-07-25 | Disposition: A | Discharge status: Home / Self Care | Payer: No Typology Code available for payment source | Source: Ambulatory Visit | Attending: Surgery | Admitting: Surgery

## 2021-07-25 DIAGNOSIS — Z01818 Encounter for other preprocedural examination: Secondary | ICD-10-CM

## 2021-07-25 DIAGNOSIS — Z87891 Personal history of nicotine dependence: Secondary | ICD-10-CM | POA: Insufficient documentation

## 2021-07-25 DIAGNOSIS — Z955 Presence of coronary angioplasty implant and graft: Secondary | ICD-10-CM | POA: Insufficient documentation

## 2021-07-25 DIAGNOSIS — I252 Old myocardial infarction: Secondary | ICD-10-CM | POA: Diagnosis not present

## 2021-07-25 DIAGNOSIS — I10 Essential (primary) hypertension: Secondary | ICD-10-CM | POA: Insufficient documentation

## 2021-07-25 DIAGNOSIS — Z20822 Contact with and (suspected) exposure to covid-19: Secondary | ICD-10-CM | POA: Diagnosis not present

## 2021-07-25 DIAGNOSIS — F419 Anxiety disorder, unspecified: Secondary | ICD-10-CM | POA: Diagnosis not present

## 2021-07-25 DIAGNOSIS — I2510 Atherosclerotic heart disease of native coronary artery without angina pectoris: Secondary | ICD-10-CM | POA: Insufficient documentation

## 2021-07-25 DIAGNOSIS — M1712 Unilateral primary osteoarthritis, left knee: Secondary | ICD-10-CM | POA: Insufficient documentation

## 2021-07-25 DIAGNOSIS — I447 Left bundle-branch block, unspecified: Secondary | ICD-10-CM | POA: Diagnosis not present

## 2021-07-25 DIAGNOSIS — K219 Gastro-esophageal reflux disease without esophagitis: Secondary | ICD-10-CM | POA: Insufficient documentation

## 2021-07-25 HISTORY — DX: Acute myocardial infarction, unspecified: I21.9

## 2021-07-25 HISTORY — DX: Atherosclerotic heart disease of native coronary artery without angina pectoris: I25.10

## 2021-07-25 HISTORY — DX: Essential (primary) hypertension: I10

## 2021-07-25 HISTORY — DX: Pneumonia, unspecified organism: J18.9

## 2021-07-25 LAB — SARS CORONAVIRUS 2 (TAT 6-24 HRS): SARS Coronavirus 2: NEGATIVE

## 2021-07-25 LAB — SURGICAL PCR SCREEN
MRSA, PCR: NEGATIVE
Staphylococcus aureus: NEGATIVE

## 2021-07-25 LAB — CBC
HCT: 45.6 % (ref 39.0–52.0)
Hemoglobin: 14.9 g/dL (ref 13.0–17.0)
MCH: 30 pg (ref 26.0–34.0)
MCHC: 32.7 g/dL (ref 30.0–36.0)
MCV: 91.9 fL (ref 80.0–100.0)
Platelets: 233 10*3/uL (ref 150–400)
RBC: 4.96 MIL/uL (ref 4.22–5.81)
RDW: 12.5 % (ref 11.5–15.5)
WBC: 10.2 10*3/uL (ref 4.0–10.5)
nRBC: 0 % (ref 0.0–0.2)

## 2021-07-25 LAB — COMPREHENSIVE METABOLIC PANEL
ALT: 16 U/L (ref 0–44)
AST: 17 U/L (ref 15–41)
Albumin: 3.3 g/dL — ABNORMAL LOW (ref 3.5–5.0)
Alkaline Phosphatase: 76 U/L (ref 38–126)
Anion gap: 7 (ref 5–15)
BUN: 16 mg/dL (ref 8–23)
CO2: 29 mmol/L (ref 22–32)
Calcium: 9.1 mg/dL (ref 8.9–10.3)
Chloride: 105 mmol/L (ref 98–111)
Creatinine, Ser: 1.24 mg/dL (ref 0.61–1.24)
GFR, Estimated: >60 mL/min (ref >60)
Glucose, Bld: 122 mg/dL — ABNORMAL HIGH (ref 70–99)
Potassium: 4 mmol/L (ref 3.5–5.1)
Sodium: 141 mmol/L (ref 135–145)
Total Bilirubin: 0.5 mg/dL (ref 0.3–1.2)
Total Protein: 6.4 g/dL — ABNORMAL LOW (ref 6.5–8.1)

## 2021-07-25 NOTE — Progress Notes (Addendum)
Anesthesia Chart Review:  Case: [id number] Date/Time: 07/29/21 1025   Procedure: LEFT TOTAL KNEE ARTHROPLASTY (Left: Knee)   Anesthesia type: Spinal   Pre-op diagnosis: left knee osteoarthritis   [facility]: [facility] ROOM 05 / [facility]   Surgeons: [name], MD       DISCUSSION: Patient is a 76 year old male scheduled for the above procedure.  Patient evaluated at 07/25/2021 PAT visit.  History includes former smoker (quit 02/07/15), CAD (MI, s/p DES LAD, provisional POBA D1 04/25/15; non-obstructive CAD 04/28/17), HTN, anxiety, GERD.   PCP is Kirstie Peri, MD. Medical clearance note received.   He is not routinely followed by cardiologist; however, [facility] PCP consulted cardiology on 09/04/20 because patient reported "hard to explain", "slight pressure" with atypical central chest pain and occasional brief palpitations (on metoprolol). Symptoms more notable in the past few months, but not associated with exertion. Primary care inquired about televisit versus stress test. Dr. Opal Sidles, felt the "symptoms sound non-cardiac, however an exercise stress test is reasonable to pursue." After further cardiology review by Anne Hahn, PA-C on 10/16/20 and discussing with Dr. Smith Robert, an Adenosine Myoview appeared to be the most appropriate stress test given atypical symptoms, known LBBB, and non-obstructive CAD on 03/2017. She added, "The biggest reason  for the change from recommendation for exercise study to pharmacolgic study is the presence of the LBBB on EKG. It should be further noted that pt had an Adensoine stress in 03/2015 that was  abnormal (findings consistent with prior infarct along with peri-infarct ischemia in the LAD distribution), but findings confounded by the LBBB (pt subsequently had LHC, had prox LAD lesion that was PCI'd in 03/2015). Pt has had echos, previous stress tests, and a MUGA in the past (part of the issue was determining whether or not pt had severe LV dysfunction), but most recent Echo  and MUGA (2019) indicated that pt had minimal to no LV dysfunction and ongoing med mgt recommended (pt previously followed by PA A. Hermelinda Medicus)." However, when he arrived for the 11/01/20 stress test he refused adenosine due to how poorly it made him feel with previous stress test. He was evaluated by Milinda Cave, PA-C/Swaminathan, Rajesh, MD (attending) and underwent an exercise ETT with modified Bruce Protocol and nuclear imaging. Study did not reproduce any atypical chest pain symptoms and showed no definite ischemia (to HR of 97 bpm), but results were limited since he did not meet THR.  EF ~ 55%. Attending Rubie Maid, MD attested to results.   Since he is not formally followed by cardiology, I evaluated [name] during his PAT visit. He said he has not had any further chest symptoms. Denied chest pain, SOB, palpitations, syncope, presyncope, edema, orthopnea. His activity is limited due to bilateral knee arthritis. He does walk his poodle daily for about 1/2-1 miles on mainly flat terrain. He has to rest around 1/4 - 1/2 miles due to some dyspnea and knee pain, but says this has been for at least a year. He has not had any progressive symptoms. He is not a known diabetic.   Reviewed available information with anesthesiologist [name], MD on 07/25/21. Anesthesia team will assess for any new changes on the day of surgery. In the meantime, I was able to get previous cardiology records from the [facility]. Testing outlined below (see CV).    07/25/21 COVID-19 test negative.    VS: BP 117/66    Pulse 72    Temp 36.8 C  Resp 18    Ht 5\' 11"  (1.803 m)    Wt 82.1 kg    SpO2 100%    BMI 25.23 kg/m  Provider wore N95 and universal face masks. Patient wore K95 type face mask. No conversations dyspnea. Heart RRR, no murmur noted. No carotid bruits noted. Lungs clear. No pretibial edema noted. He denied personal or family history of anesthesia complications. Denied prior back surgery.     PROVIDERS: Kirstie PeriShah, Ashish, MD is PCP [facility] is Angus PalmsAlbergo, [name], [name]. [facility] was from 06/02/18 with [name], Adam, PA.  CAD was stable, and EF recovered. Note suggests he may require cardiology future follow-up for new symptoms and/or some procedures, but otherwise he was released back to primary care for on-going follow-up.    LABS: Labs reviewed: Acceptable for surgery. (all labs ordered are listed, but only abnormal results are displayed)  Labs Reviewed  COMPREHENSIVE METABOLIC PANEL - Abnormal; Notable for the following components:      Result Value   Glucose, Bld 122 (*)    Total Protein 6.4 (*)    Albumin 3.3 (*)    All other components within normal limits  SURGICAL PCR SCREEN  SARS CORONAVIRUS 2 (TAT 6-24 HRS)  CBC     IMAGES: CXR 07/25/21:  FINDINGS: - Cardiomediastinal silhouette unchanged in size and contour. No evidence of central vascular congestion. No interlobular septal thickening. - Improved aeration at left lung base. No evidence of confluent airspace disease. - No pneumothorax or pleural effusion. Coarsened interstitial markings. - No acute displaced fracture. Degenerative changes of the spine. - Scoliotic curvature of the spine IMPRESSION: Improved aeration of the lungs, with no definite evidence of acute cardiopulmonary disease   EKG: 07/25/21:  Normal sinus rhythm Left bundle branch block (old) - [facility] records indicate LBBB was first noted in 2010.   CV: Exercise Nuclear Myocardial Perfusion Stress Test (he refused adenosine due to how it made him feel with previous stress test) 11/01/20: Findings:  Stress/rest SPECT scintigraphy:  - Review of the stress 3-plane reconstruction scintigrams reveals a  subtle reversible inferoseptal wall perfusion defect, possibly  related to known left bundle branch block.  - The left ventricle is of approximately normal size with an  estimated LV EDV of 85 mL.  A gated study suggests that the left  ventricular ejection fraction is approximately normal with an  estimated LV EF of 55%.  - No regional wall motion abnormalities are present and the walls  thicken normally.  - Attenuation correction CT images demonstrate a stent in the  region of the LAD, otherwise moderate right coronary artery  calcifications. Stable 4 mm left lower lobe pulmonary nodule on  series 1, image 10.  Impression: 1. Target heart rate not achieved. The patient's usual clinical  syndrome of atypical chest pain was not reproduced during stress  (to a heart rate of 97 bpm). Within this limitation, no definite  imaging evidence of stress-induced ischemia.  2. Please also see separate same-day cardiology stress test  consult note.    MUGA Scan 11/05/17 [facility]):  Impression: Normal rest MUGA. EF 61%. HR 71. BP 123/63. Wall motion was normal in all projections.   Echocardiogram 10/22/17 [facility]):  Left ventricle is normal in size.  There is mild left ventricular hypertrophy.  Ejection fraction 45%.  Grade 1 relaxation abnormality is noted (E/A reversal pattern).  Septal motion is consistent with conduction abnormality.  There is mild global hypokinesis of  the left ventricle.  RVSP 16 mmHg.  Compared to prior report on 05/20/2017: There is no significant change.  The study was technically limited.  There is mild global hypokinesis of the left ventricle.  EF 45%.  This was a technically difficult study and if an accurate measure of EF is required, recommend MRI or MUGA. - Comparison [facility] echo 07/20/16: LVEF 35%, moderate global LV dysfunction, grade 1 diastolic dysfunction, normal RV size and function aortic root 3.8 cm.    Holter Monitor 04/23/17-11/1/8 [facility]): Sinus rhythm throughout the recording with periods of sinus bradycardia and sinus tachycardia.  Ventricular ectopy consisted of isolated PVCs (9) and 1 couplet.  Supraventricular ectopy consisted of isolated PACs (40), bigeminal  cycles (25) and one 13 beat run with a rate of 140 bpm at 22:35 hrs. on 04/23/2017.  There were no pauses greater than 2 seconds.  Diary entries were associated with sinus rhythm, sinus rhythm with PACs, or sinus tachycardia.   Cardiac cath 04/28/17 [facility]): Left main: 30% Proximal LAD 30%.  Mid LAD 30%. 1st Diagonal stent across. Proximal circumflex 30%. Proximal RCA 20%.  Mid RCA 40%.  Distal RCA 20%. Summary: Nonobstructive CAD Dominance: Right dominant Final diagnosis: 1.  Diffuse nonobstructive CAD without clear culprit for unstable angina. 2.  No notable disease that requires intervention at this time as he appears stable from cath 2 years ago. Recommendations: Continue medical management.   PCI 04/27/15 [facility]):  Final results: Reasonable angiographic result with PCI of LAD/D1 bifurcation that actually also crosses over D2 origin, performed with direct stent technique from the RRA approach.  Single stent technique with Xience Alpine DES in LAD, provisional POBA to D1 ostium, the POT of proximal LAD stent more suitable to size of ectatic inflow segment. - Done for abnormal Adenosine MPI 03/23/15: Abnormal myocardial perfusion scintigraphy suggesting prior myocardial infarction with possible peri-infarct ischemia in LAD distribution, LVEF 44%, septal akinesis and apical hypokinesis. Wall motion abnormalities would be consistent with LBBB, but cannot exclude myocardial infarction.   Past Medical History:  Diagnosis Date   Acid reflux    Anxiety    Arthritis    Coronary artery disease    Hypertension    Myocardial infarction (HCC)    Osteoporosis    Pneumonia     Past Surgical History:  Procedure Laterality Date   CARDIAC CATHETERIZATION     CARDIAC SURGERY     colonoscopy     HIP FRACTURE SURGERY Left    pins placed    MEDICATIONS:  acetaminophen (TYLENOL) 650 MG CR tablet   aspirin 81 MG chewable tablet   atorvastatin (LIPITOR) 80 MG tablet   chlorhexidine (HIBICLENS)  4 % external liquid   clindamycin (CLEOCIN T) 1 % lotion   clobetasol cream (TEMOVATE) 0.05 %   diclofenac Sodium (VOLTAREN) 1 % GEL   HYDROcodone-acetaminophen (NORCO) 10-325 MG per tablet   metoprolol tartrate (LOPRESSOR) 25 MG tablet   minocycline (MINOCIN) 100 MG capsule   omeprazole (PRILOSEC) 20 MG capsule   Salicylic Acid 6 % SHAM   sildenafil (VIAGRA) 50 MG tablet   tamsulosin (FLOMAX) 0.4 MG CAPS capsule   tretinoin (RETIN-A) 0.1 % cream   No current facility-administered medications for this encounter.    Shonna Chock, PA-C Surgical Short Stay/Anesthesiology [facility] Phone [phone] [facility] Phone [phone] 07/26/2021 4:01 PM

## 2021-07-25 NOTE — Progress Notes (Signed)
PCP - Dr. Sherryll Burger Cardiologist - patient states he had a cardiologist through   PPM/ICD - n/a Device Orders -  Rep Notified -   Chest x-ray - 07/25/21 EKG -  07/25/21 Stress Test - Patient states he has had some but is unsure when the last one was.  It was done at [facility], [facility] ECHO -  Cardiac Cath - 2017  Sleep Study - n/a CPAP -   Fasting Blood Sugar - n/a Checks Blood Sugar _____ times a day  Blood Thinner Instructions:  n/a Aspirin Instructions: patient instructed to follow surgeon's instructions  ERAS Protcol - clears until 0740 PRE-SURGERY Ensure or G2- Ensure 0740  COVID TEST- at PAT appointment   Anesthesia review: yes  Patient denies shortness of breath, fever, cough and chest pain at PAT appointment   All instructions explained to the patient, with a verbal understanding of the material. Patient agrees to go over the instructions while at home for a better understanding. Patient also instructed to self quarantine after being tested for COVID-19. The opportunity to ask questions was provided.

## 2021-07-26 ENCOUNTER — Encounter (HOSPITAL_COMMUNITY): Payer: Self-pay

## 2021-07-26 NOTE — Anesthesia Preprocedure Evaluation (Addendum)
Anesthesia Evaluation  Patient identified by MRN, date of birth, ID band Patient awake    Reviewed: Allergy & Precautions, H&P , NPO status , Patient's Chart, lab work & pertinent test results  Airway Mallampati: II  TM Distance: >3 FB Neck ROM: Full    Dental no notable dental hx. (+) Teeth Intact, Dental Advisory Given, Missing, Poor Dentition, Chipped   Pulmonary neg pulmonary ROS, former smoker,    Pulmonary exam normal breath sounds clear to auscultation       Cardiovascular Exercise Tolerance: Good hypertension, Pt. on medications + CAD and + Past MI  negative cardio ROS Normal cardiovascular exam Rhythm:Regular Rate:Normal     Neuro/Psych Anxiety negative neurological ROS  negative psych ROS   GI/Hepatic negative GI ROS, Neg liver ROS, GERD  ,  Endo/Other  negative endocrine ROS  Renal/GU negative Renal ROS  negative genitourinary   Musculoskeletal negative musculoskeletal ROS (+) Arthritis , Osteoarthritis,    Abdominal   Peds negative pediatric ROS (+)  Hematology negative hematology ROS (+)   Anesthesia Other Findings   Reproductive/Obstetrics negative OB ROS                           Anesthesia Physical Anesthesia Plan  ASA: 3  Anesthesia Plan: MAC and Spinal   Post-op Pain Management: Regional block   Induction: Intravenous  PONV Risk Score and Plan: 1 and Ondansetron and Propofol infusion  Airway Management Planned: Nasal Cannula, Natural Airway, Simple Face Mask and Mask  Additional Equipment: None  Intra-op Plan:   Post-operative Plan:   Informed Consent: I have reviewed the patients History and Physical, chart, labs and discussed the procedure including the risks, benefits and alternatives for the proposed anesthesia with the patient or authorized representative who has indicated his/her understanding and acceptance.       Plan Discussed with:  Anesthesiologist and CRNA  Anesthesia Plan Comments: (See PAT note written 07/26/2021 by Myra Gianotti, PA-C. EKG: 07/25/21:  Normal sinus rhythm Left bundle branch block (old) - [place] records indicate LBBB was first noted in 2010.  Exercise Nuclear Myocardial Perfusion Stress Test (he refused adenosine due to how it made him feel with previous stress test) 11/01/20: Findings:  Stress/rest SPECT scintigraphy:  - Review of the stress 3-plane reconstruction scintigrams reveals a  subtle reversible inferoseptal wall perfusion defect, possibly  related to known left bundle branch block.  - The left ventricle is of approximately normal size with an  estimated LV EDV of 85 mL. A gated study suggests that the left  ventricular ejection fraction is approximately normal with an  estimated LV EF of 55%.  - No regional wall motion abnormalities are present and the walls  thicken normally.  - Attenuation correction CT images demonstrate a stent in the  region of the LAD, otherwise moderate right coronary artery  calcifications. Stable 4 mm left lower lobe pulmonary nodule on  series 1, image 10.  Impression: 1. Target heart rate not achieved. The patient's usual clinical  syndrome of atypical chest pain was not reproduced during stress  (to a heart rate of 97 bpm). Within this limitation, no definite  imaging evidence of stress-induced ischemia.  2. Please also see separate same-day cardiology stress test  consult note.    MUGA Scan 11/05/17 [facility]):  Impression: Normal rest MUGA. EF 61%. HR 71. BP 123/63. Wall motion was normal in all projections.   Echocardiogram 10/22/17 [facility]):  Left ventricle is normal  in size.  There is mild left ventricular hypertrophy.  Ejection fraction 45%.  Grade 1 relaxation abnormality is noted (E/A reversal pattern).  Septal motion is consistent with conduction abnormality.  There is mild global hypokinesis of the left ventricle.  RVSP 16 mmHg.  Compared to  prior report on 05/20/2017: There is no significant change.  The study was technically limited.  There is mild global hypokinesis of the left ventricle.  EF 45%.  This was a technically difficult study and if an accurate measure of EF is required, recommend MRI or MUGA. - Comparison [facility] echo 07/20/16: LVEF 35%, moderate global LV dysfunction, grade 1 diastolic dysfunction, normal RV size and function aortic root 3.8 cm.    Holter Monitor 04/23/17-11/1/8 [facility]): Sinus rhythm throughout the recording with periods of sinus bradycardia and sinus tachycardia.  Ventricular ectopy consisted of isolated PVCs (9) and 1 couplet.  Supraventricular ectopy consisted of isolated PACs (40), bigeminal cycles (25) and one 13 beat run with a rate of 140 bpm at 22:35 hrs. on 04/23/2017.  There were no pauses greater than 2 seconds.  Diary entries were associated with sinus rhythm, sinus rhythm with PACs, or sinus tachycardia.   Cardiac cath 04/28/17 [facility]): Left main: 30% Proximal LAD 30%.  Mid LAD 30%. 1st Diagonal stent across. Proximal circumflex 30%. Proximal RCA 20%.  Mid RCA 40%.  Distal RCA 20%. Summary: Nonobstructive CAD Dominance: Right dominant Final diagnosis: 1.  Diffuse nonobstructive CAD without clear culprit for unstable angina. 2.  No notable disease that requires intervention at this time as he appears stable from cath 2 years ago. Recommendations: Continue medical management.)       Anesthesia Quick Evaluation

## 2021-07-26 NOTE — H&P (Signed)
TOTAL KNEE ADMISSION H&P  Patient is being admitted for left total knee arthroplasty.  Subjective:  Chief Complaint:left knee pain.  HPI: Thomas [name], 76 y.o. male, has a history of pain and functional disability in the left knee due to arthritis and has failed non-surgical conservative treatments for greater than 12 weeks to includeNSAID's and/or analgesics, corticosteriod injections, use of assistive devices, and activity modification.  Onset of symptoms was gradual, starting 10 years ago with gradually worsening course since that time.  Patient currently rates pain in the left knee(s) at 10 out of 10 with activity. Patient has night pain, pain with passive range of motion, crepitus, and joint swelling.  Patient has evidence of subchondral sclerosis, periarticular osteophytes, and joint space narrowing by imaging studies.  Patient Active Problem List   Diagnosis Date Noted   Unilateral primary osteoarthritis, left knee 05/19/2021   Past Medical History:  Diagnosis Date   Acid reflux    Anxiety    Arthritis    Coronary artery disease    Hypertension    Myocardial infarction (HCC)    Osteoporosis    Pneumonia     Past Surgical History:  Procedure Laterality Date   CARDIAC CATHETERIZATION     CARDIAC SURGERY     colonoscopy     HIP FRACTURE SURGERY Left    pins placed    No current facility-administered medications for this encounter.   Current Outpatient Medications  Medication Sig Dispense Refill Last Dose   acetaminophen (TYLENOL) 650 MG CR tablet Take 650 mg by mouth 2 (two) times daily.      aspirin 81 MG chewable tablet Chew 81 mg by mouth daily.      atorvastatin (LIPITOR) 80 MG tablet Take 80 mg by mouth at bedtime.      chlorhexidine (HIBICLENS) 4 % external liquid Apply 1 application topically 4 (four) times a week.      clindamycin (CLEOCIN T) 1 % lotion Apply 1 application topically daily.      clobetasol cream (TEMOVATE) 0.05 % Apply 1 application topically 2  (two) times daily as needed (psoriasis).      diclofenac Sodium (VOLTAREN) 1 % GEL Apply 1 application topically 4 (four) times daily as needed (pain).      HYDROcodone-acetaminophen (NORCO) 10-325 MG per tablet Take 1 tablet by mouth 3 (three) times daily as needed for moderate pain.      metoprolol tartrate (LOPRESSOR) 25 MG tablet Take 25 mg by mouth 2 (two) times daily.      minocycline (MINOCIN) 100 MG capsule Take 100 mg by mouth 2 (two) times daily as needed (rash). Take for 14 days at a time      omeprazole (PRILOSEC) 20 MG capsule Take 20 mg by mouth daily.      Salicylic Acid 6 % SHAM Apply 1 application topically 4 (four) times a week.      sildenafil (VIAGRA) 50 MG tablet Take 25 mg by mouth daily.      tamsulosin (FLOMAX) 0.4 MG CAPS capsule Take 0.4 mg by mouth at bedtime.      tretinoin (RETIN-A) 0.1 % cream Apply 1 application topically daily as needed (redness).      Allergies  Allergen Reactions   Amoxicillin     Unknown reaction   Simvastatin Other (See Comments)    Throat swelling    Social History   Tobacco Use   Smoking status: Former    Types: Cigarettes    Quit date: 02/07/2020  Years since quitting: 1.4   Smokeless tobacco: Never  Substance Use Topics   Alcohol use: No    No family history on file.   Review of Systems  Constitutional:  Positive for activity change.  HENT: Negative.    Respiratory: Negative.    Cardiovascular: Negative.   Gastrointestinal: Negative.   Genitourinary: Negative.   Musculoskeletal:  Positive for gait problem and joint swelling.  Psychiatric/Behavioral: Negative.     Objective:  Physical Exam HENT:     Head: Normocephalic and atraumatic.     Nose: Nose normal.  Eyes:     Extraocular Movements: Extraocular movements intact.  Pulmonary:     Breath sounds: Normal breath sounds.  Abdominal:     General: There is no distension.  Musculoskeletal:        General: Tenderness present.  Neurological:     Mental  Status: He is alert and oriented to person, place, and time.  Psychiatric:        Mood and Affect: Mood normal.    Vital signs in last 24 hours:    Labs:   Estimated body mass index is 25.23 kg/m as calculated from the following:   Height as of 07/25/21: 5\' 11"  (1.803 m).   Weight as of 07/25/21: 82.1 kg.   Imaging Review Plain radiographs demonstrate moderate degenerative joint disease of the left knee(s). The overall alignment ismild varus. The bone quality appears to be good for age and reported activity level.      Assessment/Plan:  End stage arthritis, left knee   The patient history, physical examination, clinical judgment of the provider and imaging studies are consistent with end stage degenerative joint disease of the left knee(s) and total knee arthroplasty is deemed medically necessary. The treatment options including medical management, injection therapy arthroscopy and arthroplasty were discussed at length. The risks and benefits of total knee arthroplasty were presented and reviewed. The risks due to aseptic loosening, infection, stiffness, patella tracking problems, thromboembolic complications and other imponderables were discussed. The patient acknowledged the explanation, agreed to proceed with the plan and consent was signed. Patient is being admitted for inpatient treatment for surgery, pain control, PT, OT, prophylactic antibiotics, VTE prophylaxis, progressive ambulation and ADL's and discharge planning. The patient is planning to be discharged home with home health services     Patient's anticipated LOS is less than 2 midnights, meeting these requirements: - Younger than 98 - Lives within 1 hour of care - Has a competent adult at home to recover with post-op recover - NO history of  - Chronic pain requiring opiods  - Diabetes  - Coronary Artery Disease  - Heart failure  - Heart attack  - Stroke  - DVT/VTE  - Cardiac arrhythmia  - Respiratory  Failure/COPD  - Renal failure  - Anemia  - Advanced Liver disease

## 2021-07-26 NOTE — Progress Notes (Signed)
Patient is being admitted for left total knee arthroplasty.  Subjective:  Chief Complaint:left knee pain.  HPI: Thomas [name], 76 y.o. male, has a history of pain and functional disability in the left knee due to arthritis and has failed non-surgical conservative treatments for greater than 12 weeks to includeNSAID's and/or analgesics, corticosteriod injections, use of assistive devices, and activity modification.  Onset of symptoms was gradual, starting 10 years ago with gradually worsening course since that time.  Patient currently rates pain in the left knee(s) at 10 out of 10 with activity. Patient has night pain, pain with passive range of motion, crepitus, and joint swelling.  Patient has evidence of subchondral sclerosis, periarticular osteophytes, and joint space narrowing by imaging studies.  Patient Active Problem List   Diagnosis Date Noted   Unilateral primary osteoarthritis, left knee 05/19/2021   Past Medical History:  Diagnosis Date   Acid reflux    Anxiety    Arthritis    Coronary artery disease    Hypertension    Myocardial infarction (HCC)    Osteoporosis    Pneumonia     Past Surgical History:  Procedure Laterality Date   CARDIAC CATHETERIZATION     CARDIAC SURGERY     colonoscopy     HIP FRACTURE SURGERY Left    pins placed    No current facility-administered medications for this encounter.   Current Outpatient Medications  Medication Sig Dispense Refill Last Dose   acetaminophen (TYLENOL) 650 MG CR tablet Take 650 mg by mouth 2 (two) times daily.      aspirin 81 MG chewable tablet Chew 81 mg by mouth daily.      atorvastatin (LIPITOR) 80 MG tablet Take 80 mg by mouth at bedtime.      chlorhexidine (HIBICLENS) 4 % external liquid Apply 1 application topically 4 (four) times a week.      clindamycin (CLEOCIN T) 1 % lotion Apply 1 application topically daily.      clobetasol cream (TEMOVATE) 0.05 % Apply 1 application topically 2 (two) times daily as needed  (psoriasis).      diclofenac Sodium (VOLTAREN) 1 % GEL Apply 1 application topically 4 (four) times daily as needed (pain).      HYDROcodone-acetaminophen (NORCO) 10-325 MG per tablet Take 1 tablet by mouth 3 (three) times daily as needed for moderate pain.      metoprolol tartrate (LOPRESSOR) 25 MG tablet Take 25 mg by mouth 2 (two) times daily.      minocycline (MINOCIN) 100 MG capsule Take 100 mg by mouth 2 (two) times daily as needed (rash). Take for 14 days at a time      omeprazole (PRILOSEC) 20 MG capsule Take 20 mg by mouth daily.      Salicylic Acid 6 % SHAM Apply 1 application topically 4 (four) times a week.      sildenafil (VIAGRA) 50 MG tablet Take 25 mg by mouth daily.      tamsulosin (FLOMAX) 0.4 MG CAPS capsule Take 0.4 mg by mouth at bedtime.      tretinoin (RETIN-A) 0.1 % cream Apply 1 application topically daily as needed (redness).      Allergies  Allergen Reactions   Amoxicillin     Unknown reaction   Simvastatin Other (See Comments)    Throat swelling    Social History   Tobacco Use   Smoking status: Former    Types: Cigarettes    Quit date: 02/07/2020    Years since quitting: 1.4  Smokeless tobacco: Never  Substance Use Topics   Alcohol use: No    No family history on file.   Review of Systems  Constitutional:  Positive for activity change.  HENT: Negative.    Respiratory: Negative.    Cardiovascular: Negative.   Gastrointestinal: Negative.   Genitourinary: Negative.   Musculoskeletal:  Positive for gait problem and joint swelling.  Psychiatric/Behavioral: Negative.     Objective:  Physical Exam HENT:     Head: Normocephalic and atraumatic.     Nose: Nose normal.  Eyes:     Extraocular Movements: Extraocular movements intact.  Pulmonary:     Breath sounds: Normal breath sounds.  Abdominal:     General: There is no distension.  Musculoskeletal:        General: Tenderness present.  Neurological:     Mental Status: He is alert and oriented  to person, place, and time.  Psychiatric:        Mood and Affect: Mood normal.    Vital signs in last 24 hours:    Labs:   Estimated body mass index is 25.23 kg/m as calculated from the following:   Height as of 07/25/21: 5\' 11"  (1.803 m).   Weight as of 07/25/21: 82.1 kg.   Imaging Review Plain radiographs demonstrate moderate degenerative joint disease of the left knee(s). The overall alignment ismild varus. The bone quality appears to be good for age and reported activity level.      Assessment/Plan:  End stage arthritis, left knee   The patient history, physical examination, clinical judgment of the provider and imaging studies are consistent with end stage degenerative joint disease of the left knee(s) and total knee arthroplasty is deemed medically necessary. The treatment options including medical management, injection therapy arthroscopy and arthroplasty were discussed at length. The risks and benefits of total knee arthroplasty were presented and reviewed. The risks due to aseptic loosening, infection, stiffness, patella tracking problems, thromboembolic complications and other imponderables were discussed. The patient acknowledged the explanation, agreed to proceed with the plan and consent was signed. Patient is being admitted for inpatient treatment for surgery, pain control, PT, OT, prophylactic antibiotics, VTE prophylaxis, progressive ambulation and ADL's and discharge planning. The patient is planning to be discharged home with home health services     Patient's anticipated LOS is less than 2 midnights, meeting these requirements: - Younger than 28 - Lives within 1 hour of care - Has a competent adult at home to recover with post-op recover - NO history of  - Chronic pain requiring opiods  - Diabetes  - Coronary Artery Disease  - Heart failure  - Heart attack  - Stroke  - DVT/VTE  - Cardiac arrhythmia  - Respiratory Failure/COPD  - Renal failure  -  Anemia  - Advanced Liver disease

## 2021-07-29 ENCOUNTER — Ambulatory Visit (HOSPITAL_COMMUNITY): Payer: No Typology Code available for payment source | Admitting: Anesthesiology

## 2021-07-29 ENCOUNTER — Other Ambulatory Visit: Payer: Self-pay

## 2021-07-29 ENCOUNTER — Observation Stay (HOSPITAL_COMMUNITY)
Admission: RE | Admit: 2021-07-29 | Discharge: 2021-07-30 | Disposition: A | Discharge status: Home Health | Payer: No Typology Code available for payment source | Attending: Orthopaedic Surgery | Admitting: Orthopaedic Surgery

## 2021-07-29 ENCOUNTER — Ambulatory Visit (HOSPITAL_COMMUNITY): Payer: No Typology Code available for payment source | Admitting: Vascular Surgery

## 2021-07-29 ENCOUNTER — Encounter (HOSPITAL_COMMUNITY)
Admission: RE | Disposition: A | Discharge status: Home Health | Payer: Self-pay | Source: Home / Self Care | Attending: Orthopaedic Surgery

## 2021-07-29 ENCOUNTER — Encounter (HOSPITAL_COMMUNITY): Payer: Self-pay | Admitting: Orthopaedic Surgery

## 2021-07-29 DIAGNOSIS — Z87891 Personal history of nicotine dependence: Secondary | ICD-10-CM | POA: Insufficient documentation

## 2021-07-29 DIAGNOSIS — Z79899 Other long term (current) drug therapy: Secondary | ICD-10-CM | POA: Insufficient documentation

## 2021-07-29 DIAGNOSIS — I10 Essential (primary) hypertension: Secondary | ICD-10-CM | POA: Insufficient documentation

## 2021-07-29 DIAGNOSIS — M1712 Unilateral primary osteoarthritis, left knee: Principal | ICD-10-CM

## 2021-07-29 DIAGNOSIS — Z01818 Encounter for other preprocedural examination: Secondary | ICD-10-CM

## 2021-07-29 DIAGNOSIS — I2510 Atherosclerotic heart disease of native coronary artery without angina pectoris: Secondary | ICD-10-CM | POA: Diagnosis not present

## 2021-07-29 HISTORY — PX: TOTAL KNEE ARTHROPLASTY: SHX125

## 2021-07-29 SURGERY — ARTHROPLASTY, KNEE, TOTAL
Anesthesia: Monitor Anesthesia Care | Site: Knee | Laterality: Left

## 2021-07-29 MED ORDER — FENTANYL CITRATE (PF) 250 MCG/5ML IJ SOLN
INTRAMUSCULAR | Status: DC | PRN
  Administered 2021-07-29: 50 ug via INTRAVENOUS

## 2021-07-29 MED ORDER — BUPIVACAINE IN DEXTROSE 0.75-8.25 % IT SOLN
INTRATHECAL | Status: DC | PRN
Start: 2021-07-29 — End: 2021-07-29
  Administered 2021-07-29: 1.8 mL via INTRATHECAL

## 2021-07-29 MED ORDER — ONDANSETRON HCL 4 MG/2ML IJ SOLN: 4.0000 mg | Freq: Four times a day (QID) | INTRAMUSCULAR | Status: DC | PRN

## 2021-07-29 MED ORDER — PROPOFOL 10 MG/ML IV BOLUS
INTRAVENOUS | Status: AC
  Filled 2021-07-29: qty 20

## 2021-07-29 MED ORDER — ACETAMINOPHEN 160 MG/5ML PO SOLN: 325.0000 mg | ORAL | Status: DC | PRN

## 2021-07-29 MED ORDER — BUPIVACAINE LIPOSOME 1.3 % IJ SUSP
INTRAMUSCULAR | Status: AC
  Filled 2021-07-29: qty 20

## 2021-07-29 MED ORDER — OXYCODONE HCL 5 MG PO TABS
5.0000 mg | ORAL_TABLET | ORAL | Status: DC | PRN
  Administered 2021-07-30 (×3): 5 mg via ORAL
  Filled 2021-07-29 (×3): qty 1

## 2021-07-29 MED ORDER — LACTATED RINGERS IV SOLN: INTRAVENOUS | Status: DC

## 2021-07-29 MED ORDER — SODIUM CHLORIDE 0.9 % IR SOLN
Status: DC | PRN
  Administered 2021-07-29: 4000 mL

## 2021-07-29 MED ORDER — DOCUSATE SODIUM 100 MG PO CAPS
100.0000 mg | ORAL_CAPSULE | Freq: Two times a day (BID) | ORAL | Status: DC
  Administered 2021-07-29 – 2021-07-30 (×2): 100 mg via ORAL
  Filled 2021-07-29 (×2): qty 1

## 2021-07-29 MED ORDER — OXYCODONE HCL 5 MG/5ML PO SOLN: 5.0000 mg | Freq: Once | ORAL | Status: DC | PRN

## 2021-07-29 MED ORDER — BUPIVACAINE HCL (PF) 0.5 % IJ SOLN
INTRAMUSCULAR | Status: AC
  Filled 2021-07-29: qty 30

## 2021-07-29 MED ORDER — BUPIVACAINE-EPINEPHRINE (PF) 0.5% -1:200000 IJ SOLN
INTRAMUSCULAR | Status: DC | PRN
  Administered 2021-07-29: 15 mL via PERINEURAL

## 2021-07-29 MED ORDER — FENTANYL CITRATE (PF) 250 MCG/5ML IJ SOLN
INTRAMUSCULAR | Status: AC
  Filled 2021-07-29: qty 5

## 2021-07-29 MED ORDER — BUPIVACAINE HCL (PF) 0.25 % IJ SOLN
INTRAMUSCULAR | Status: AC
  Filled 2021-07-29: qty 30

## 2021-07-29 MED ORDER — HYDROMORPHONE HCL 1 MG/ML IJ SOLN
INTRAMUSCULAR | Status: AC
  Filled 2021-07-29: qty 1

## 2021-07-29 MED ORDER — BUPIVACAINE HCL (PF) 0.25 % IJ SOLN
INTRAMUSCULAR | Status: DC | PRN
Start: 2021-07-29 — End: 2021-07-29
  Administered 2021-07-29: 20 mL

## 2021-07-29 MED ORDER — MIDAZOLAM HCL 2 MG/2ML IJ SOLN
INTRAMUSCULAR | Status: DC | PRN
  Administered 2021-07-29: 1 mg via INTRAVENOUS

## 2021-07-29 MED ORDER — PHENYLEPHRINE 40 MCG/ML (10ML) SYRINGE FOR IV PUSH (FOR BLOOD PRESSURE SUPPORT)
PREFILLED_SYRINGE | INTRAVENOUS | Status: DC | PRN
  Administered 2021-07-29: 120 ug via INTRAVENOUS
  Administered 2021-07-29 (×2): 80 ug via INTRAVENOUS
  Administered 2021-07-29 (×2): 120 ug via INTRAVENOUS

## 2021-07-29 MED ORDER — DEXAMETHASONE SODIUM PHOSPHATE 10 MG/ML IJ SOLN
INTRAMUSCULAR | Status: AC
  Filled 2021-07-29: qty 1

## 2021-07-29 MED ORDER — OXYCODONE HCL 5 MG PO TABS
10.0000 mg | ORAL_TABLET | Freq: Once | ORAL | Status: AC
  Administered 2021-07-29: 10 mg via ORAL

## 2021-07-29 MED ORDER — METHOCARBAMOL 1000 MG/10ML IJ SOLN: 500.0000 mg | Freq: Four times a day (QID) | INTRAVENOUS | Status: DC | PRN

## 2021-07-29 MED ORDER — HYDROMORPHONE HCL 1 MG/ML IJ SOLN
0.5000 mg | INTRAMUSCULAR | Status: DC | PRN
  Administered 2021-07-29 (×3): 0.5 mg via INTRAVENOUS
  Filled 2021-07-29: qty 0.5

## 2021-07-29 MED ORDER — BUPIVACAINE LIPOSOME 1.3 % IJ SUSP
20.0000 mL | Freq: Once | INTRAMUSCULAR | Status: AC
  Administered 2021-07-29: 10 mL
  Filled 2021-07-29: qty 20

## 2021-07-29 MED ORDER — FENTANYL CITRATE (PF) 100 MCG/2ML IJ SOLN: 25.0000 ug | INTRAMUSCULAR | Status: DC | PRN

## 2021-07-29 MED ORDER — TRANEXAMIC ACID-NACL 1000-0.7 MG/100ML-% IV SOLN
INTRAVENOUS | Status: DC | PRN
  Administered 2021-07-29: 1000 mg via INTRAVENOUS

## 2021-07-29 MED ORDER — ONDANSETRON HCL 4 MG/2ML IJ SOLN
INTRAMUSCULAR | Status: DC | PRN
  Administered 2021-07-29: 4 mg via INTRAVENOUS

## 2021-07-29 MED ORDER — METHOCARBAMOL 500 MG PO TABS
500.0000 mg | ORAL_TABLET | Freq: Four times a day (QID) | ORAL | Status: DC | PRN
  Administered 2021-07-29 – 2021-07-30 (×2): 500 mg via ORAL
  Filled 2021-07-29: qty 1

## 2021-07-29 MED ORDER — DEXAMETHASONE SODIUM PHOSPHATE 10 MG/ML IJ SOLN
INTRAMUSCULAR | Status: DC | PRN
  Administered 2021-07-29: 10 mg via INTRAVENOUS

## 2021-07-29 MED ORDER — LIDOCAINE 2% (20 MG/ML) 5 ML SYRINGE
INTRAMUSCULAR | Status: AC
  Filled 2021-07-29: qty 5

## 2021-07-29 MED ORDER — CHLORHEXIDINE GLUCONATE 0.12 % MT SOLN: 15.0000 mL | Freq: Once | OROMUCOSAL | Status: AC

## 2021-07-29 MED ORDER — ORAL CARE MOUTH RINSE: 15.0000 mL | Freq: Once | OROMUCOSAL | Status: AC

## 2021-07-29 MED ORDER — CEFAZOLIN SODIUM-DEXTROSE 2-4 GM/100ML-% IV SOLN
2.0000 g | INTRAVENOUS | Status: AC
  Administered 2021-07-29: 2 g via INTRAVENOUS
  Filled 2021-07-29: qty 100

## 2021-07-29 MED ORDER — MELATONIN 3 MG PO TABS
3.0000 mg | ORAL_TABLET | Freq: Once | ORAL | Status: AC
  Administered 2021-07-29: 3 mg via ORAL
  Filled 2021-07-29: qty 1

## 2021-07-29 MED ORDER — ACETAMINOPHEN 325 MG PO TABS: 325.0000 mg | ORAL_TABLET | Freq: Four times a day (QID) | ORAL | Status: DC | PRN

## 2021-07-29 MED ORDER — PHENOL 1.4 % MT LIQD: 1.0000 | OROMUCOSAL | Status: DC | PRN

## 2021-07-29 MED ORDER — POLYETHYLENE GLYCOL 3350 17 G PO PACK: 17.0000 g | PACK | Freq: Every day | ORAL | Status: DC | PRN

## 2021-07-29 MED ORDER — OXYCODONE HCL 5 MG PO TABS: 5.0000 mg | ORAL_TABLET | Freq: Once | ORAL | Status: DC | PRN

## 2021-07-29 MED ORDER — ONDANSETRON HCL 4 MG/2ML IJ SOLN: 4.0000 mg | Freq: Once | INTRAMUSCULAR | Status: DC | PRN

## 2021-07-29 MED ORDER — LACTATED RINGERS IV SOLN: INTRAVENOUS | Status: DC | PRN

## 2021-07-29 MED ORDER — METOCLOPRAMIDE HCL 5 MG PO TABS: 5.0000 mg | ORAL_TABLET | Freq: Three times a day (TID) | ORAL | Status: DC | PRN

## 2021-07-29 MED ORDER — ONDANSETRON HCL 4 MG/2ML IJ SOLN
INTRAMUSCULAR | Status: AC
  Filled 2021-07-29: qty 2

## 2021-07-29 MED ORDER — METOCLOPRAMIDE HCL 5 MG/ML IJ SOLN: 5.0000 mg | Freq: Three times a day (TID) | INTRAMUSCULAR | Status: DC | PRN

## 2021-07-29 MED ORDER — MENTHOL 3 MG MT LOZG: 1.0000 | LOZENGE | OROMUCOSAL | Status: DC | PRN

## 2021-07-29 MED ORDER — OXYCODONE HCL 5 MG PO TABS
ORAL_TABLET | ORAL | Status: AC
  Filled 2021-07-29: qty 2

## 2021-07-29 MED ORDER — ACETAMINOPHEN 325 MG PO TABS: 325.0000 mg | ORAL_TABLET | ORAL | Status: DC | PRN

## 2021-07-29 MED ORDER — METHOCARBAMOL 500 MG PO TABS
ORAL_TABLET | ORAL | Status: AC
  Filled 2021-07-29: qty 1

## 2021-07-29 MED ORDER — TRANEXAMIC ACID-NACL 1000-0.7 MG/100ML-% IV SOLN
INTRAVENOUS | Status: AC
  Filled 2021-07-29: qty 100

## 2021-07-29 MED ORDER — PROPOFOL 10 MG/ML IV BOLUS
INTRAVENOUS | Status: DC | PRN
  Administered 2021-07-29: 20 mg via INTRAVENOUS

## 2021-07-29 MED ORDER — SODIUM CHLORIDE 0.9 % IV SOLN: INTRAVENOUS | Status: DC

## 2021-07-29 MED ORDER — PHENYLEPHRINE HCL-NACL 20-0.9 MG/250ML-% IV SOLN
INTRAVENOUS | Status: DC | PRN
Start: 2021-07-29 — End: 2021-07-29
  Administered 2021-07-29: 40 ug/min via INTRAVENOUS

## 2021-07-29 MED ORDER — PROPOFOL 500 MG/50ML IV EMUL
INTRAVENOUS | Status: DC | PRN
  Administered 2021-07-29: 75 ug/kg/min via INTRAVENOUS

## 2021-07-29 MED ORDER — MIDAZOLAM HCL 2 MG/2ML IJ SOLN
INTRAMUSCULAR | Status: AC
  Filled 2021-07-29: qty 2

## 2021-07-29 MED ORDER — ONDANSETRON HCL 4 MG PO TABS: 4.0000 mg | ORAL_TABLET | Freq: Four times a day (QID) | ORAL | Status: DC | PRN

## 2021-07-29 MED ORDER — PHENYLEPHRINE 40 MCG/ML (10ML) SYRINGE FOR IV PUSH (FOR BLOOD PRESSURE SUPPORT)
PREFILLED_SYRINGE | INTRAVENOUS | Status: AC
  Filled 2021-07-29: qty 10

## 2021-07-29 MED ORDER — BUPIVACAINE LIPOSOME 1.3 % IJ SUSP
INTRAMUSCULAR | Status: DC | PRN
  Administered 2021-07-29: 10 mL

## 2021-07-29 MED ORDER — METOPROLOL TARTRATE 25 MG PO TABS
25.0000 mg | ORAL_TABLET | Freq: Two times a day (BID) | ORAL | Status: DC
  Administered 2021-07-29 – 2021-07-30 (×2): 25 mg via ORAL
  Filled 2021-07-29 (×2): qty 1

## 2021-07-29 MED ORDER — MEPERIDINE HCL 25 MG/ML IJ SOLN: 6.2500 mg | INTRAMUSCULAR | Status: DC | PRN

## 2021-07-29 MED ORDER — CHLORHEXIDINE GLUCONATE 0.12 % MT SOLN
OROMUCOSAL | Status: AC
  Administered 2021-07-29: 15 mL via OROMUCOSAL
  Filled 2021-07-29: qty 15

## 2021-07-29 MED ORDER — ASPIRIN EC 325 MG PO TBEC
325.0000 mg | DELAYED_RELEASE_TABLET | Freq: Every day | ORAL | Status: DC
  Administered 2021-07-30: 325 mg via ORAL
  Filled 2021-07-29: qty 1

## 2021-07-29 SURGICAL SUPPLY — 73 items
ATTUNE MED DOME PAT 38 KNEE (Knees) ×1 IMPLANT
ATTUNE PS FEM LT SZ 6 CEM KNEE (Femur) ×1 IMPLANT
ATTUNE PSRP INSR SZ6 6 KNEE (Insert) ×1 IMPLANT
BAG COUNTER SPONGE SURGICOUNT (BAG) ×2 IMPLANT
BANDAGE ESMARK 6X9 LF (GAUZE/BANDAGES/DRESSINGS) ×1 IMPLANT
BASE TIBIAL ROT PLAT SZ 7 KNEE (Knees) IMPLANT
BENZOIN TINCTURE PRP APPL 2/3 (GAUZE/BANDAGES/DRESSINGS) ×2 IMPLANT
BLADE SAGITTAL 25.0X1.19X90 (BLADE) ×2 IMPLANT
BLADE SAW SGTL 11.0X1.19X90.0M (BLADE) ×1 IMPLANT
BLADE SAW SGTL 13X75X1.27 (BLADE) ×2 IMPLANT
BNDG ELASTIC 4X5.8 VLCR STR LF (GAUZE/BANDAGES/DRESSINGS) ×2 IMPLANT
BNDG ELASTIC 6X5.8 VLCR STR LF (GAUZE/BANDAGES/DRESSINGS) ×1 IMPLANT
BNDG ESMARK 6X9 LF (GAUZE/BANDAGES/DRESSINGS) ×2
BOWL SMART MIX CTS (DISPOSABLE) ×2 IMPLANT
CEMENT HV SMART SET (Cement) ×4 IMPLANT
COVER SURGICAL LIGHT HANDLE (MISCELLANEOUS) ×2 IMPLANT
CUFF TOURN SGL QUICK 34 (TOURNIQUET CUFF) ×1
CUFF TRNQT CYL 34X4.125X (TOURNIQUET CUFF) ×1 IMPLANT
DRAPE ORTHO SPLIT 77X108 STRL (DRAPES) ×2
DRAPE POUCH INSTRU U-SHP 10X18 (DRAPES) ×1 IMPLANT
DRAPE SURG ORHT 6 SPLT 77X108 (DRAPES) ×2 IMPLANT
DRAPE U-SHAPE 47X51 STRL (DRAPES) ×2 IMPLANT
DRSG MEPILEX BORDER 4X12 (GAUZE/BANDAGES/DRESSINGS) ×1 IMPLANT
DRSG PAD ABDOMINAL 8X10 ST (GAUZE/BANDAGES/DRESSINGS) ×2 IMPLANT
DURAPREP 26ML APPLICATOR (WOUND CARE) ×4 IMPLANT
ELECT REM PT RETURN 9FT ADLT (ELECTROSURGICAL) ×2
ELECTRODE REM PT RTRN 9FT ADLT (ELECTROSURGICAL) ×1 IMPLANT
FACESHIELD WRAPAROUND (MASK) ×4 IMPLANT
FACESHIELD WRAPAROUND OR TEAM (MASK) ×2 IMPLANT
GAUZE SPONGE 4X4 12PLY STRL (GAUZE/BANDAGES/DRESSINGS) ×2 IMPLANT
GAUZE XEROFORM 5X9 LF (GAUZE/BANDAGES/DRESSINGS) ×2 IMPLANT
GLOVE SRG 8 PF TXTR STRL LF DI (GLOVE) ×2 IMPLANT
GLOVE SURG ORTHO LTX SZ7.5 (GLOVE) ×4 IMPLANT
GLOVE SURG UNDER POLY LF SZ8 (GLOVE) ×2
GOWN STRL REUS W/ TWL LRG LVL3 (GOWN DISPOSABLE) ×1 IMPLANT
GOWN STRL REUS W/ TWL XL LVL3 (GOWN DISPOSABLE) ×1 IMPLANT
GOWN STRL REUS W/TWL 2XL LVL3 (GOWN DISPOSABLE) ×2 IMPLANT
GOWN STRL REUS W/TWL LRG LVL3 (GOWN DISPOSABLE) ×1
GOWN STRL REUS W/TWL XL LVL3 (GOWN DISPOSABLE) ×1
HANDPIECE INTERPULSE COAX TIP (DISPOSABLE) ×1
IMMOBILIZER KNEE 22 UNIV (SOFTGOODS) ×2 IMPLANT
KIT BASIN OR (CUSTOM PROCEDURE TRAY) ×2 IMPLANT
KIT TURNOVER KIT B (KITS) ×2 IMPLANT
MANIFOLD NEPTUNE II (INSTRUMENTS) ×3 IMPLANT
MARKER SKIN DUAL TIP RULER LAB (MISCELLANEOUS) ×2 IMPLANT
NDL 18GX1X1/2 (RX/OR ONLY) (NEEDLE) ×1 IMPLANT
NDL HYPO 25GX1X1/2 BEV (NEEDLE) ×1 IMPLANT
NEEDLE 18GX1X1/2 (RX/OR ONLY) (NEEDLE) ×2 IMPLANT
NEEDLE HYPO 25GX1X1/2 BEV (NEEDLE) ×2 IMPLANT
NS IRRIG 1000ML POUR BTL (IV SOLUTION) ×2 IMPLANT
PACK TOTAL JOINT (CUSTOM PROCEDURE TRAY) ×2 IMPLANT
PAD ARMBOARD 7.5X6 YLW CONV (MISCELLANEOUS) ×4 IMPLANT
PAD CAST 4YDX4 CTTN HI CHSV (CAST SUPPLIES) ×1 IMPLANT
PADDING CAST COTTON 4X4 STRL (CAST SUPPLIES) ×1
PADDING CAST COTTON 6X4 STRL (CAST SUPPLIES) ×2 IMPLANT
SET HNDPC FAN SPRY TIP SCT (DISPOSABLE) ×1 IMPLANT
SPONGE T-LAP 18X18 ~~LOC~~+RFID (SPONGE) ×2 IMPLANT
STAPLER VISISTAT 35W (STAPLE) ×1 IMPLANT
SUCTION FRAZIER HANDLE 10FR (MISCELLANEOUS) ×1
SUCTION TUBE FRAZIER 10FR DISP (MISCELLANEOUS) ×1 IMPLANT
SUT VIC AB 0 CT1 27 (SUTURE) ×2
SUT VIC AB 0 CT1 27XBRD ANBCTR (SUTURE) ×1 IMPLANT
SUT VIC AB 1 CTX 36 (SUTURE) ×2
SUT VIC AB 1 CTX36XBRD ANBCTR (SUTURE) ×2 IMPLANT
SUT VIC AB 2-0 CT1 27 (SUTURE) ×2
SUT VIC AB 2-0 CT1 TAPERPNT 27 (SUTURE) ×2 IMPLANT
SUT VIC AB 3-0 X1 27 (SUTURE) ×2 IMPLANT
SYR 50ML LL SCALE MARK (SYRINGE) ×2 IMPLANT
SYR CONTROL 10ML LL (SYRINGE) ×2 IMPLANT
TIBIAL BASE ROT PLAT SZ 7 KNEE (Knees) ×2 IMPLANT
TOWEL GREEN STERILE (TOWEL DISPOSABLE) ×2 IMPLANT
TOWEL GREEN STERILE FF (TOWEL DISPOSABLE) ×2 IMPLANT
TRAY CATH 16FR W/PLASTIC CATH (SET/KITS/TRAYS/PACK) IMPLANT

## 2021-07-29 NOTE — Plan of Care (Signed)
°  Problem: Health Behavior/Discharge Planning: °Goal: Ability to manage health-related needs will improve °Outcome: Progressing °  °Problem: Clinical Measurements: °Goal: Respiratory complications will improve °Outcome: Progressing °  °Problem: Clinical Measurements: °Goal: Cardiovascular complication will be avoided °Outcome: Progressing °  °

## 2021-07-29 NOTE — Interval H&P Note (Signed)
History and Physical Interval Note:  07/29/2021 7:32 AM  Thomas [name]  has presented today for surgery, with the diagnosis of left knee osteoarthritis.  The various methods of treatment have been discussed with the patient and family. After consideration of risks, benefits and other options for treatment, the patient has consented to  Procedure(s): LEFT TOTAL KNEE ARTHROPLASTY (Left) as a surgical intervention.  The patient's history has been reviewed, patient examined, no change in status, stable for surgery.  I have reviewed the patient's chart and labs.  Questions were answered to the patient's satisfaction.     [name]

## 2021-07-29 NOTE — Op Note (Signed)
Preop diagnosis: Left knee primary osteoarthritis  Postop diagnosis: Same  Procedure left total knee arthroplasty.  Surgeon: Annell Greening MD  Assistant: Zonia Kief, PA-C medically necessary and present for the entire procedure  Anesthesia his preoperative abductor block for spinal plus Exparel and Marcaine.  Tourniquet time less than 1 hour.  X300 mm pressure.  Implants:Implants  CEMENT HV SMART SET - [id number]  Inventory Item: CEMENT HV SMART SET Serial no.:  Model/Cat no.: [id number]  Implant name: CEMENT HV SMART SET - [id number] Laterality: Left Area: Knee  Manufacturer: [employer] Date of Manufacture:    Action: Implanted Number Used: 2   Device Identifier:  Device Identifier Type:     TIBIAL BASE ROT PLAT SZ 7 KNEE - [id number]  Inventory Item: TIBIAL BASE ROT PLAT SZ 7 KNEE Serial no.:  Model/Cat no.: [id number]  Implant name: TIBIAL BASE ROT PLAT SZ 7 KNEE - [id number] Laterality: Left Area: Knee  Manufacturer: [employer] Date of Manufacture:    Action: Implanted Number Used: 1   Device Identifier:  Device Identifier Type:     ATTUNE PSRP INSR SZ6 KNEE - [id number]  Inventory Item: ATTUNE PSRP INSR SZ6 KNEE Serial no.:  Model/Cat no.: [id number]  Implant name: ATTUNE PSRP INSR SZ6 KNEE - [id number] Laterality: Left Area: Knee  Manufacturer: [employer] Date of Manufacture:    Action: Implanted Number Used: 1   Device Identifier:  Device Identifier Type:     ATTUNE MED DOME PAT KNEE - [id number]  Inventory Item: ATTUNE MED DOME PAT KNEE Serial no.:  Model/Cat no.: [id number]  Implant name: ATTUNE MED DOME PAT KNEE - [id number] Laterality: Left Area: Knee  Manufacturer: [employer] Date of Manufacture:    Action: Implanted Number Used: 1   Device Identifier:  Device Identifier Type:     ATTUNE PS FEM LT SZ 6 CEM KNEE - [id number]  Inventory Item: ATTUNE PS FEM LT SZ 6 CEM KNEE Serial no.:  Model/Cat no.: [id number]   Implant name: ATTUNE PS FEM LT SZ 6 CEM KNEE - [id number] Laterality: Left Area: Knee  Manufacturer: [employer] Date of Manufacture:    Action: Implanted Number Used: 1   Device Identifier:  Device Identifier Type:    Procedure: After induction of spinal anesthesia proximal thigh tourniquet preoperative Ancef IV TXA lateral post heel bump standard prepping with DuraPrep the tip of the toes normal total knee sheets drapes sterile skin marker Betadine Steri-Drape were applied timeout procedure completed.  Leg was wrapped in Esmarch tourniquet inflated.  Midline incision was made medial parapatellar incision.  Patella was everted 10 mm.  Intramedullary hole drilled.  10 mm distal cut on the femur none on the tibia spacer block showed a 6 gave full extension.  Sizing for the femur was a size 5 however trial with like it was a little bit narrow.  We made For the 5 but it appeared that there was better coverage with the size 6.  Tibia was size 7.  Box cuts were made trials were inserted with 6 and a 6 mm spacer.  Full extension collateral ligaments were balanced pulse lavage.  Patella was cut and 38 mm 3 peg patella was selected.  Leg lengths were drilled on the femur.  Pulse lavage back to mixing of cement tibia cemented first followed by femur placement of permanent poly and then the patella held with a patellar clamp excessive cement was removed.  Once the cement was hardened 15 minutes  tourniquet was deflated tourniquet time was 58 minutes.  Hemostasis was obtained in standard layered closure #1 Vicryl in the medial retinaculum 2-0 Vicryl subtenons tissue skin staple closure postop dressing and knee immobilizer.

## 2021-07-29 NOTE — Progress Notes (Signed)
Orthopedic Tech Progress Note Patient Details:  Thomas [name] [birth year] [id number]  CPM Left Knee CPM Left Knee: On Left Knee Flexion (Degrees): 0 Left Knee Extension (Degrees): 60  Post Interventions Patient Tolerated: Well Instructions Provided: Care of device  Donald Pore 07/29/2021, 11:47 AM

## 2021-07-29 NOTE — Anesthesia Postprocedure Evaluation (Signed)
Anesthesia Post Note  Patient: Thomas [name]  Procedure(s) Performed: LEFT TOTAL KNEE ARTHROPLASTY (Left: Knee)     Patient location during evaluation: PACU Anesthesia Type: MAC, Regional and Spinal Level of consciousness: oriented and awake and alert Pain management: pain level controlled Vital Signs Assessment: post-procedure vital signs reviewed and stable Respiratory status: spontaneous breathing, respiratory function stable and patient connected to nasal cannula oxygen Cardiovascular status: blood pressure returned to baseline and stable Postop Assessment: no headache, no backache and no apparent nausea or vomiting Anesthetic complications: no   No notable events documented.  Last Vitals:  Vitals:   07/29/21 1405 07/29/21 1435  BP: (!) 107/53 (!) 107/53  Pulse: 86 93  Resp: 15 17  Temp:    SpO2: 99% 99%    Last Pain:  Vitals:   07/29/21 1441  TempSrc:   PainSc: 7                  Chloie Loney

## 2021-07-29 NOTE — Anesthesia Procedure Notes (Signed)
Anesthesia Regional Block: Adductor canal block   Pre-Anesthetic Checklist: , timeout performed,  Correct Patient, Correct Site, Correct Laterality,  Correct Procedure, Correct Position, site marked,  Risks and benefits discussed,  Surgical consent,  Pre-op evaluation,  At surgeon's request and post-op pain management  Laterality: Right  Prep: chloraprep       Needles:  Injection technique: Single-shot  Needle Type: Echogenic Stimulator Needle     Needle Length: 5cm  Needle Gauge: 22     Additional Needles:   Procedures:, nerve stimulator,,, ultrasound used (permanent image in chart),,    Narrative:  Start time: 07/29/2021 7:09 AM End time: 07/29/2021 7:15 AM Injection made incrementally with aspirations every 5 mL.  Performed by: Personally  Anesthesiologist: Bethena Midget, MD  Additional Notes: Functioning IV was confirmed and monitors were applied.  A 71mm 22ga Arrow echogenic stimulator needle was used. Sterile prep and drape,hand hygiene and sterile gloves were used. Ultrasound guidance: relevant anatomy identified, needle position confirmed, local anesthetic spread visualized around nerve(s)., vascular puncture avoided.  Image printed for medical record. Negative aspiration and negative test dose prior to incremental administration of local anesthetic. The patient tolerated the procedure well.

## 2021-07-29 NOTE — Transfer of Care (Signed)
Immediate Anesthesia Transfer of Care Note  Patient: Thomas [name]  Procedure(s) Performed: LEFT TOTAL KNEE ARTHROPLASTY (Left: Knee)  Patient Location: PACU  Anesthesia Type:MAC and Spinal  Level of Consciousness: awake, oriented and patient cooperative  Airway & Oxygen Therapy: Patient Spontanous Breathing and Patient connected to face mask oxygen  Post-op Assessment: Report given to RN and Post -op Vital signs reviewed and stable  Post vital signs: Reviewed  Last Vitals:  Vitals Value Taken Time  BP 110/60 07/29/21 1004  Temp    Pulse 71 07/29/21 1010  Resp 17 07/29/21 1010  SpO2 100 % 07/29/21 1010  Vitals shown include unvalidated device data.  Last Pain:  Vitals:   07/29/21 0613  TempSrc: Oral  PainSc:          Complications: No notable events documented.

## 2021-07-30 ENCOUNTER — Encounter (HOSPITAL_COMMUNITY): Payer: Self-pay | Admitting: Orthopaedic Surgery

## 2021-07-30 DIAGNOSIS — M1712 Unilateral primary osteoarthritis, left knee: Secondary | ICD-10-CM | POA: Diagnosis not present

## 2021-07-30 LAB — CBC
HCT: 36.2 % — ABNORMAL LOW (ref 39.0–52.0)
Hemoglobin: 11.9 g/dL — ABNORMAL LOW (ref 13.0–17.0)
MCH: 29.5 pg (ref 26.0–34.0)
MCHC: 32.9 g/dL (ref 30.0–36.0)
MCV: 89.8 fL (ref 80.0–100.0)
Platelets: 192 10*3/uL (ref 150–400)
RBC: 4.03 MIL/uL — ABNORMAL LOW (ref 4.22–5.81)
RDW: 12.6 % (ref 11.5–15.5)
WBC: 14 10*3/uL — ABNORMAL HIGH (ref 4.0–10.5)
nRBC: 0 % (ref 0.0–0.2)

## 2021-07-30 LAB — BASIC METABOLIC PANEL
Anion gap: 8 (ref 5–15)
BUN: 18 mg/dL (ref 8–23)
CO2: 24 mmol/L (ref 22–32)
Calcium: 8.7 mg/dL — ABNORMAL LOW (ref 8.9–10.3)
Chloride: 103 mmol/L (ref 98–111)
Creatinine, Ser: 1.21 mg/dL (ref 0.61–1.24)
GFR, Estimated: >60 mL/min (ref >60)
Glucose, Bld: 137 mg/dL — ABNORMAL HIGH (ref 70–99)
Potassium: 4.3 mmol/L (ref 3.5–5.1)
Sodium: 135 mmol/L (ref 135–145)

## 2021-07-30 MED ORDER — ASPIRIN 325 MG PO TBEC: 325.0000 mg | DELAYED_RELEASE_TABLET | Freq: Every day | ORAL | 0 refills | Status: AC

## 2021-07-30 MED ORDER — OXYCODONE-ACETAMINOPHEN 5-325 MG PO TABS: 1.0000 | ORAL_TABLET | Freq: Four times a day (QID) | ORAL | 0 refills | Status: DC | PRN

## 2021-07-30 MED ORDER — METHOCARBAMOL 500 MG PO TABS: 500.0000 mg | ORAL_TABLET | Freq: Three times a day (TID) | ORAL | 0 refills | Status: DC | PRN

## 2021-07-30 NOTE — Discharge Summary (Signed)
Patient ID: Thomas [name] MRN: [id number] DOB/AGE: September 24, 1945 76 y.o.  Admit date: 07/29/2021 Discharge date: 07/30/2021  Admission Diagnoses:  Principal Problem:   Arthritis of left knee Active Problems:   Unilateral primary osteoarthritis, left knee   Discharge Diagnoses:  Principal Problem:   Arthritis of left knee Active Problems:   Unilateral primary osteoarthritis, left knee  status post Procedure(s): LEFT TOTAL KNEE ARTHROPLASTY  Past Medical History:  Diagnosis Date   Acid reflux    Anxiety    Arthritis    Coronary artery disease    Hypertension    Myocardial infarction (HCC)    Osteoporosis    Pneumonia     Surgeries: Procedure(s): LEFT TOTAL KNEE ARTHROPLASTY on 07/29/2021   Consultants:   Discharged Condition: Improved  Hospital Course: Thomas [name] is an 75 y.o. male who was admitted 07/29/2021 for operative treatment of Arthritis of left knee. Patient failed conservative treatments (please see the history and physical for the specifics) and had severe unremitting pain that affects sleep, daily activities and work/hobbies. After pre-op clearance, the patient was taken to the operating room on 07/29/2021 and underwent  Procedure(s): LEFT TOTAL KNEE ARTHROPLASTY.    Patient was given perioperative antibiotics:  Anti-infectives (From admission, onward)    Start     Dose/Rate Route Frequency Ordered Stop   07/29/21 0600  ceFAZolin (ANCEF) IVPB 2g/100 mL premix        2 g 200 mL/hr over 30 Minutes Intravenous On call to O.R. 07/29/21 0549 07/29/21 0825        Patient was given sequential compression devices and early ambulation to prevent DVT.   Patient benefited maximally from hospital stay and there were no complications. At the time of discharge, the patient was urinating/moving their bowels without difficulty, tolerating a regular diet, pain is controlled with oral pain medications and they have been cleared by PT/OT.   Recent vital signs:  Patient Vitals for the past 24 hrs:  BP Temp Temp src Pulse Resp SpO2 Height Weight  07/30/21 0802 114/72 98 F (36.7 C) -- 78 18 99 % -- --  07/30/21 0800 114/72 -- Oral 78 17 -- -- --  07/29/21 2106 131/72 98.5 F (36.9 C) Oral 97 20 98 % -- --  07/29/21 1903 -- -- -- -- -- -- 5\' 10"  (1.778 m) 82.8 kg  07/29/21 1858 121/78 98.3 F (36.8 C) Oral 100 18 100 % -- --  07/29/21 1735 138/73 -- -- 91 16 100 % -- --  07/29/21 1705 112/73 -- -- 92 12 100 % -- --  07/29/21 1635 120/68 -- -- 94 18 100 % -- --  07/29/21 1505 116/81 -- -- 90 20 98 % -- --     Recent laboratory studies:  Recent Labs    07/30/21 0256  WBC 14.0*  HGB 11.9*  HCT 36.2*  PLT 192  NA 135  K 4.3  CL 103  CO2 24  BUN 18  CREATININE 1.21  GLUCOSE 137*  CALCIUM 8.7*     Discharge Medications:   Allergies as of 07/30/2021       Reactions   Amoxicillin    Unknown reaction   Simvastatin Other (See Comments)   Throat swelling        Medication List     STOP taking these medications    acetaminophen 650 MG CR tablet Commonly known as: TYLENOL   aspirin 81 MG chewable tablet Replaced by: aspirin 325 MG EC tablet  TAKE these medications    aspirin 325 MG EC tablet Take 1 tablet (325 mg total) by mouth daily with breakfast. Start taking on: July 31, 2021 Replaces: aspirin 81 MG chewable tablet   atorvastatin 80 MG tablet Commonly known as: LIPITOR Take 80 mg by mouth at bedtime.   chlorhexidine 4 % external liquid Commonly known as: HIBICLENS Apply 1 application topically 4 (four) times a week.   clindamycin 1 % lotion Commonly known as: CLEOCIN T Apply 1 application topically daily.   clobetasol cream 0.05 % Commonly known as: TEMOVATE Apply 1 application topically 2 (two) times daily as needed (psoriasis).   diclofenac Sodium 1 % Gel Commonly known as: VOLTAREN Apply 1 application topically 4 (four) times daily as needed (pain).   HYDROcodone-acetaminophen 10-325 MG  tablet Commonly known as: NORCO Take 1 tablet by mouth 3 (three) times daily as needed for moderate pain.   methocarbamol 500 MG tablet Commonly known as: ROBAXIN Take 1 tablet (500 mg total) by mouth every 8 (eight) hours as needed for muscle spasms.   metoprolol tartrate 25 MG tablet Commonly known as: LOPRESSOR Take 25 mg by mouth 2 (two) times daily.   minocycline 100 MG capsule Commonly known as: MINOCIN Take 100 mg by mouth 2 (two) times daily as needed (rash). Take for 14 days at a time   omeprazole 20 MG capsule Commonly known as: PRILOSEC Take 20 mg by mouth daily.   oxyCODONE-acetaminophen 5-325 MG tablet Commonly known as: Percocet Take 1-2 tablets by mouth every 6 (six) hours as needed for severe pain.   Salicylic Acid 6 % Sham Apply 1 application topically 4 (four) times a week.   sildenafil 50 MG tablet Commonly known as: VIAGRA Take 25 mg by mouth daily.   tamsulosin 0.4 MG Caps capsule Commonly known as: FLOMAX Take 0.4 mg by mouth at bedtime.   tretinoin 0.1 % cream Commonly known as: RETIN-A Apply 1 application topically daily as needed (redness).        Diagnostic Studies: DG Chest 2 View  Result Date: 07/26/2021 CLINICAL DATA:  76 year old male with a history of left total knee arthroplasty EXAM: CHEST - 2 VIEW COMPARISON:  02/21/2015 FINDINGS: Cardiomediastinal silhouette unchanged in size and contour. No evidence of central vascular congestion. No interlobular septal thickening. Improved aeration at left lung base. No evidence of confluent airspace disease. No pneumothorax or pleural effusion. Coarsened interstitial markings. No acute displaced fracture. Degenerative changes of the spine. Scoliotic curvature of the spine IMPRESSION: Improved aeration of the lungs, with no definite evidence of acute cardiopulmonary disease Electronically Signed   By: Gilmer Mor D.O.   On: 07/26/2021 10:29       Follow-up Information     [name], MD  Follow up in 9 day(s).   Specialty: Orthopedic Surgery Why: one week from thursday see Dr. Ophelia Charter in [facility] clinic as scheduled Contact information: [address] [place] [address] [phone]                 Discharge Plan:  discharge to home  Disposition:     Signed: Zonia Kief  07/30/2021, 3:01 PM

## 2021-07-30 NOTE — Plan of Care (Signed)

## 2021-07-30 NOTE — TOC Transition Note (Addendum)
Transition of Care [facility]) - CM/SW Discharge Note   Patient Details  Name: Thomas [name] MRN: [id number] Date of Birth: [birth year]  [facility]) CM/SW Contact:  Epifanio Lesches, RN Phone Number: 07/30/2021, 3:50 PM   Clinical Narrative:    Patient will DC to: Home  Anticipated DC date: 07/30/2021 Family notified:yes Transport by: car         - L TKA on 07/29/21 Per MD patient ready for DC today . RN, patient, and patient's wife notified of DC.  Order noted for HHPT. Pt agreeable to home health services. Choice provided. Pt without preference. Referral made with [facility] and accepted. Pt without DME needs. Pt without Rx med concerns. Post hospital f/u noted on AVS.  Wif eto provide transportation to home.   [phone], pt's #  RNCM will sign off for now as intervention is no longer needed. Please consult [place] again if new needs arise.  Gurvir Schrom [facility]) Pt's #     [phone], pt's #       Final next level of care: Home w Home Health Services Barriers to Discharge: No Barriers Identified   Patient Goals and CMS Choice     Choice offered to / list presented to : Patient  Discharge Placement                       Discharge Plan and Services                          HH Arranged: PT [facility] Agency: [facility] Date [name] Agency Contacted: 07/30/21 Time HH Agency Contacted: 1515 Representative spoke with at [facility] Agency: Stacie  Social Determinants of Health (SDOH) Interventions     Readmission Risk Interventions No flowsheet data found.

## 2021-07-30 NOTE — Progress Notes (Signed)
Patient ID: Thomas [name], male   DOB: 06/18/1946, 76 y.o.   MRN: [id number] Did stairs with therapist.  Needs dressing change by RN with new Mepilex and then home PT set up and patient can be discharged.  Of

## 2021-07-30 NOTE — Evaluation (Signed)
Physical Therapy Evaluation Patient Details Name: Thomas MerlesRonald Frisby MRN: [id number] DOB: May 08, 1946 Today's Date: 07/30/2021  History of Present Illness  Pt is a 76 y.o. male s/p elective L TKA on 07/29/21. PMH includes CAD, HTN, anxiety, osteoporosis, arthritis.  Clinical Impression  Pt presents with an overall decrease in functional mobility secondary to above. PTA, pt indep with intermittent use of SPC, lives with wife, retired from work. Initiated educ re: precautions, positioning, therex, and importance of mobility. Today, pt able to initiate gait training with RW and intermittent min guard for balance; will plan for stair training this afternoon. Pt would benefit from continued acute PT services to maximize functional mobility and independence prior to d/c home.      Recommendations for follow up therapy are one component of a multi-disciplinary discharge planning process, led by the attending physician.  Recommendations may be updated based on patient status, additional functional criteria and insurance authorization.  Follow Up Recommendations Follow physician's recommendations for discharge plan and follow up therapies    Assistance Recommended at Discharge Intermittent Supervision/Assistance  Patient can return home with the following  A little help with bathing/dressing/bathroom;Assistance with cooking/housework;Assist for transportation    Equipment Recommendations None recommended by PT  Recommendations for Other Services       Functional Status Assessment Patient has had a recent decline in their functional status and demonstrates the ability to make significant improvements in function in a reasonable and predictable amount of time.     Precautions / Restrictions Precautions Precautions: Fall;Knee Restrictions Weight Bearing Restrictions: Yes LLE Weight Bearing: Weight bearing as tolerated      Mobility  Bed Mobility Overal bed mobility: Modified Independent              General bed mobility comments: HOB elevated    Transfers Overall transfer level: Needs assistance Equipment used: Rolling walker (2 wheels) Transfers: Sit to/from Stand Sit to Stand: Min guard           General transfer comment: initial cues for hand placement, min guard for balance    Ambulation/Gait Ambulation/Gait assistance: Min guard Gait Distance (Feet): 210 Feet Assistive device: Rolling walker (2 wheels) Gait Pattern/deviations: Step-to pattern, Step-through pattern, Decreased stride length, Decreased weight shift to left, Antalgic, Trunk flexed Gait velocity: Decreased     General Gait Details: Slow, antalgic gait with RW and intermittent min guard for balance; improved ability to increase LLE WBAT with distance; cues for upright posture and closer proximity to RW, cues to increase heel-to-toe gait pattern and step length  Stairs            Wheelchair Mobility    Modified Rankin (Stroke Patients Only)       Balance Overall balance assessment: Needs assistance   Sitting balance-Leahy Scale: Good Sitting balance - Comments: pt able to don bilateral  sock, pants and R shoe sitting EOB by bending to reach feet; minA for donning L shoe     Standing balance-Leahy Scale: Fair Standing balance comment: can static stand to void at toilet without UE support, unable to accept challenge                             Pertinent Vitals/Pain Pain Assessment Pain Assessment: Faces Faces Pain Scale: Hurts little more Pain Location: L knee Pain Descriptors / Indicators: Sore Pain Intervention(s): Monitored during session, Premedicated before session    Home Living Family/patient expects to be discharged to:: Private residence Living  Arrangements: Spouse/significant other Available Help at Discharge: Family;Available 24 hours/day Type of Home: House Home Access: Stairs to enter Entrance Stairs-Rails: Psychiatric nurse of Steps:  2   Home Layout: Two level;Able to live on main level with bedroom/bathroom Home Equipment: Rolling Walker (2 wheels);Shower seat;Shower seat - built in Additional Comments: Wife doesn't drive - children live nearby and plan to help as needed with transportation, groceries, etc.    Prior Function Prior Level of Function : Independent/Modified Independent             Mobility Comments: Typically indep without DME; intermittent use of SPC if knee painful. Drives. Retired from work. Enjoys walking dog daily ADLs Comments: Indep without DME     Hand Dominance        Extremity/Trunk Assessment   Upper Extremity Assessment Upper Extremity Assessment: Overall WFL for tasks assessed    Lower Extremity Assessment Lower Extremity Assessment: LLE deficits/detail LLE Deficits / Details: s/p L TKA; denies numbness/tingling; noted quad lag with partial SLR, but no knee buckling noted with standing/ambulation    Cervical / Trunk Assessment Cervical / Trunk Assessment: Normal  Communication   Communication: HOH (slight)  Cognition Arousal/Alertness: Awake/alert Behavior During Therapy: WFL for tasks assessed/performed Overall Cognitive Status: Within Functional Limits for tasks assessed                                          General Comments General comments (skin integrity, edema, etc.): discussed d/c planning - pt reports set up with HHPT services. Pt reports hopeful for d/c home after second PT session this afternoon; will plan for stair training, ambulation, therex and further education.    Exercises     Assessment/Plan    PT Assessment Patient needs continued PT services  PT Problem List Decreased strength;Decreased range of motion;Decreased activity tolerance;Decreased balance;Decreased mobility;Decreased knowledge of use of DME;Decreased knowledge of precautions;Pain       PT Treatment Interventions DME instruction;Gait training;Stair  training;Therapeutic activities;Functional mobility training;Therapeutic exercise;Balance training;Patient/family education    PT Goals (Current goals can be found in the Care Plan section)  Acute Rehab PT Goals Patient Stated Goal: return home, get back to walking dog PT Goal Formulation: With patient Time For Goal Achievement: 08/13/21 Potential to Achieve Goals: Good    Frequency 7X/week     Co-evaluation               AM-PAC PT "6 Clicks" Mobility  Outcome Measure Help needed turning from your back to your side while in a flat bed without using bedrails?: None Help needed moving from lying on your back to sitting on the side of a flat bed without using bedrails?: None Help needed moving to and from a bed to a chair (including a wheelchair)?: A Little Help needed standing up from a chair using your arms (e.g., wheelchair or bedside chair)?: A Little Help needed to walk in hospital room?: A Little Help needed climbing 3-5 steps with a railing? : A Little 6 Click Score: 20    End of Session Equipment Utilized During Treatment: Gait belt Activity Tolerance: Patient tolerated treatment well Patient left: in chair;with call bell/phone within reach;with chair alarm set Nurse Communication: Mobility status PT Visit Diagnosis: Other abnormalities of gait and mobility (R26.89);Pain Pain - Right/Left: Left Pain - part of body: Knee    Time: [id number] PT Time Calculation (min) (ACUTE ONLY):  27 min   Charges:   PT Evaluation $PT Eval Low Complexity: 1 Low PT Treatments $Gait Training: 8-22 mins      Mabeline Caras, PT, DPT Acute Rehabilitation Services  Pager [phone] Office [place] 07/30/2021, 9:07 AM

## 2021-07-30 NOTE — Progress Notes (Signed)
Patient ID: Thomas [name], male   DOB: 11/23/1945, 76 y.o.   MRN: [id number]   Subjective: 1 Day Post-Op Procedure(s) (LRB): LEFT TOTAL KNEE ARTHROPLASTY (Left) Patient reports pain as mild and moderate.    Objective: Vital signs in last 24 hours: Temp:  [97.5 F (36.4 C)-98.5 F (36.9 C)] 98 F (36.7 C) (01/31 0802) Pulse Rate:  [67-100] 78 (01/31 0802) Resp:  [11-21] 18 (01/31 0802) BP: (100-138)/(53-86) 114/72 (01/31 0802) SpO2:  [98 %-100 %] 99 % (01/31 0802) Weight:  [82.8 kg] 82.8 kg (01/30 1903)  Intake/Output from previous day: 01/30 0701 - 01/31 0700 In: 1800 [I.V.:1600; IV Piggyback:200] Out: 1975 [Urine:1925; Blood:50] Intake/Output this shift: Total I/O In: -  Out: 300 [Urine:300]  Recent Labs    07/30/21 0256  HGB 11.9*   Recent Labs    07/30/21 0256  WBC 14.0*  RBC 4.03*  HCT 36.2*  PLT 192   Recent Labs    07/30/21 0256  NA 135  K 4.3  CL 103  CO2 24  BUN 18  CREATININE 1.21  GLUCOSE 137*  CALCIUM 8.7*   No results for input(s): LABPT, INR in the last 72 hours.  Neurologically intact No results found.  Assessment/Plan: 1 Day Post-Op Procedure(s) (LRB): LEFT TOTAL KNEE ARTHROPLASTY (Left) Up with therapy. Has been OOB to chair. Stood at bedside. Possible home if does well with PT.   Thomas [name] 07/30/2021, 8:17 AM

## 2021-07-30 NOTE — Discharge Instructions (Signed)

## 2021-07-30 NOTE — Progress Notes (Signed)
Physical Therapy Treatment Patient Details Name: Thomas [name] MRN: [id number] DOB: 1946-03-19 Today's Date: 07/30/2021   History of Present Illness Pt is a 76 y.o. male s/p elective L TKA on 07/29/21. PMH includes CAD, HTN, anxiety, osteoporosis, arthritis.   PT Comments    Pt progressing well with mobility; tolerated stair training and increased ambulation distance with RW. Reviewed educ re: precautions, positioning, therex, importance of knee ROM, activity recommendations. Pt feels confident for return home; if to remain admitted, will continue to follow acutely.     Recommendations for follow up therapy are one component of a multi-disciplinary discharge planning process, led by the attending physician.  Recommendations may be updated based on patient status, additional functional criteria and insurance authorization.  Follow Up Recommendations  Follow physician's recommendations for discharge plan and follow up therapies     Assistance Recommended at Discharge Intermittent Supervision/Assistance  Patient can return home with the following A little help with bathing/dressing/bathroom;Assistance with cooking/housework;Assist for transportation   Equipment Recommendations  None recommended by PT    Recommendations for Other Services       Precautions / Restrictions Precautions Precautions: Fall Restrictions Weight Bearing Restrictions: Yes LLE Weight Bearing: Weight bearing as tolerated     Mobility  Bed Mobility Overal bed mobility: Modified Independent             General bed mobility comments: HOB elevated    Transfers Overall transfer level: Needs assistance Equipment used: Rolling walker (2 wheels) Transfers: Sit to/from Stand Sit to Stand: Supervision           General transfer comment: multiple sit<>stands from EOB and recliner to RW, good hand placement without cues; supervision for safety    Ambulation/Gait Ambulation/Gait assistance: Min guard,  Supervision Gait Distance (Feet): 250 Feet Assistive device: Rolling walker (2 wheels) Gait Pattern/deviations: Step-through pattern, Decreased stride length, Decreased weight shift to left, Antalgic Gait velocity: Decreased     General Gait Details: Slow, antalgic gait with RW and intermittent min guard for balance; noted improvements in LLE WBAT, step length heel-to-toe gait pattern and speed   Stairs Stairs: Yes Stairs assistance: Min guard Stair Management: One rail Left, Step to pattern, Sideways Number of Stairs: 4 General stair comments: Ascend/descend two steps 2x trials with BUE support on L-side rail, min guard for balance; cues for technique and sequencing; educ on having family member (pt's cousin) guard from lower step   Wheelchair Mobility    Modified Rankin (Stroke Patients Only)       Balance Overall balance assessment: Needs assistance   Sitting balance-Leahy Scale: Good Sitting balance - Comments: pt able to don bilateral  sock, pants and R shoe sitting EOB by bending to reach feet; minA for donning L shoe     Standing balance-Leahy Scale: Fair Standing balance comment: can static stand to void at toilet without UE support, unable to accept challenge                            Cognition Arousal/Alertness: Awake/alert Behavior During Therapy: WFL for tasks assessed/performed Overall Cognitive Status: Within Functional Limits for tasks assessed                                          Exercises Total Joint Exercises Quad Sets: AROM, Left, Seated Long Arc Quad: AROM, Left, Seated Knee Flexion:  AAROM, Left, Seated (active assisted with washcloth under foot)    General Comments General comments (skin integrity, edema, etc.): reviewed educ re: precautions, positioning (resting with knee extended), therex, importance of knee AROM, activity recommendations, CPM use      Pertinent Vitals/Pain Pain Assessment Pain Assessment:  Faces Faces Pain Scale: Hurts little more Pain Location: L knee Pain Descriptors / Indicators: Sore Pain Intervention(s): Monitored during session, Repositioned    Home Living                          Prior Function            PT Goals (current goals can now be found in the care plan section) Progress towards PT goals: Progressing toward goals    Frequency    7X/week      PT Plan      Co-evaluation              AM-PAC PT "6 Clicks" Mobility   Outcome Measure  Help needed turning from your back to your side while in a flat bed without using bedrails?: None Help needed moving from lying on your back to sitting on the side of a flat bed without using bedrails?: None Help needed moving to and from a bed to a chair (including a wheelchair)?: A Little Help needed standing up from a chair using your arms (e.g., wheelchair or bedside chair)?: A Little Help needed to walk in hospital room?: A Little Help needed climbing 3-5 steps with a railing? : A Little 6 Click Score: 20    End of Session Equipment Utilized During Treatment: Gait belt Activity Tolerance: Patient tolerated treatment well Patient left: in chair;with call bell/phone within reach;with chair alarm set Nurse Communication: Mobility status PT Visit Diagnosis: Other abnormalities of gait and mobility (R26.89);Pain Pain - Right/Left: Left Pain - part of body: Knee     Time: 2778-2423 PT Time Calculation (min) (ACUTE ONLY): 27 min  Charges:  $Gait Training: 8-22 mins $Therapeutic Exercise: 8-22 mins                     Ina Homes, PT, DPT Acute Rehabilitation Services  Pager [phone] Office [phone]  Malachy Chamber 07/30/2021, 1:35 PM

## 2021-08-06 DIAGNOSIS — I25118 Atherosclerotic heart disease of native coronary artery with other forms of angina pectoris: Secondary | ICD-10-CM | POA: Diagnosis not present

## 2021-08-06 DIAGNOSIS — Z789 Other specified health status: Secondary | ICD-10-CM | POA: Diagnosis not present

## 2021-08-06 DIAGNOSIS — E7800 Pure hypercholesterolemia, unspecified: Secondary | ICD-10-CM | POA: Diagnosis not present

## 2021-08-06 DIAGNOSIS — I10 Essential (primary) hypertension: Secondary | ICD-10-CM | POA: Diagnosis not present

## 2021-08-06 DIAGNOSIS — Z299 Encounter for prophylactic measures, unspecified: Secondary | ICD-10-CM | POA: Diagnosis not present

## 2021-08-08 ENCOUNTER — Ambulatory Visit (INDEPENDENT_AMBULATORY_CARE_PROVIDER_SITE_OTHER): Payer: No Typology Code available for payment source | Admitting: Orthopaedic Surgery

## 2021-08-08 ENCOUNTER — Other Ambulatory Visit: Payer: Self-pay

## 2021-08-08 ENCOUNTER — Encounter: Payer: Self-pay | Admitting: Orthopaedic Surgery

## 2021-08-08 ENCOUNTER — Ambulatory Visit (INDEPENDENT_AMBULATORY_CARE_PROVIDER_SITE_OTHER): Payer: No Typology Code available for payment source

## 2021-08-08 VITALS — Ht 70.0 in | Wt 182.0 lb

## 2021-08-08 DIAGNOSIS — Z96652 Presence of left artificial knee joint: Secondary | ICD-10-CM

## 2021-08-08 MED ORDER — OXYCODONE-ACETAMINOPHEN 5-325 MG PO TABS: 1.0000 | ORAL_TABLET | Freq: Four times a day (QID) | ORAL | 0 refills | Status: DC | PRN

## 2021-08-08 MED ORDER — METHOCARBAMOL 500 MG PO TABS: 500.0000 mg | ORAL_TABLET | Freq: Four times a day (QID) | ORAL | 0 refills | Status: DC

## 2021-08-08 NOTE — Progress Notes (Signed)
° °  Post-Op Visit Note   Patient: Thomas [name]           Date of Birth: 08/19/1945           MRN: [id number] Visit Date: 08/08/2021 PCP: Kirstie Peri, MD   Assessment & Plan: Post left total knee arthroplasty.  Mild swelling flexion 102 he lacks less than 10 degrees reaching full extension.  Doing home PT prescription Robaxin Percocet renewed.  Will transition to outpatient therapy next week.  He wants to work his left knee as hard as he can so he can get his right knee done before his authorization runs out for his opposite knee in mid May.  Recheck 4 weeks.  He will have a nurse visit in 6 days on Wednesday for staple removal and Steri-Strip application.  Chief Complaint:  Chief Complaint  Patient presents with   Left Knee - Routine Post Op    07/29/2021 left TKA   Visit Diagnoses:  1. S/P total knee arthroplasty, left     Plan: Nurse visit 6 days for staple removal.  Visit with me 4 weeks from today.  Follow-Up Instructions: Return in about 4 weeks (around 09/05/2021).   Orders:  Orders Placed This Encounter  Procedures   XR Knee 1-2 Views Left   Meds ordered this encounter  Medications   oxyCODONE-acetaminophen (PERCOCET) 5-325 MG tablet    Sig: Take 1-2 tablets by mouth every 6 (six) hours as needed for severe pain.    Dispense:  30 tablet    Refill:  0    Post op pain total knee. Use in addition to normal Norco 10/325 dosage   methocarbamol (ROBAXIN) 500 MG tablet    Sig: Take 1 tablet (500 mg total) by mouth 4 (four) times daily.    Dispense:  40 tablet    Refill:  0    Imaging: No results found.  PMFS History: Patient Active Problem List   Diagnosis Date Noted   Arthritis of left knee 07/29/2021   Unilateral primary osteoarthritis, left knee 05/19/2021   Past Medical History:  Diagnosis Date   Acid reflux    Anxiety    Arthritis    Coronary artery disease    Hypertension    Myocardial infarction [facility])    Osteoporosis    Pneumonia     No family  history on file.  Past Surgical History:  Procedure Laterality Date   CARDIAC CATHETERIZATION     colonoscopy     CORONARY ANGIOPLASTY  04/27/2015   DES in LAD, provisional POBA to D1 ostium 04/27/15 [facility]-   HIP FRACTURE SURGERY Left    pins placed   TOTAL KNEE ARTHROPLASTY Left 07/29/2021   Procedure: LEFT TOTAL KNEE ARTHROPLASTY;  Surgeon: [name], MD;  Location: [facility] OR;  Service: Orthopedics;  Laterality: Left;   Social History   Occupational History   Not on file  Tobacco Use   Smoking status: Former    Types: Cigarettes    Quit date: 02/07/2020    Years since quitting: 1.5   Smokeless tobacco: Never  Vaping Use   Vaping Use: Never used  Substance and Sexual Activity   Alcohol use: No   Drug use: No   Sexual activity: Not on file

## 2021-08-08 NOTE — Addendum Note (Signed)
Addended by: Rogers Seeds on: 08/08/2021 03:44 PM   Modules accepted: Orders

## 2021-08-12 DIAGNOSIS — M25562 Pain in left knee: Secondary | ICD-10-CM | POA: Diagnosis not present

## 2021-08-14 ENCOUNTER — Ambulatory Visit (INDEPENDENT_AMBULATORY_CARE_PROVIDER_SITE_OTHER): Payer: No Typology Code available for payment source | Admitting: Orthopaedic Surgery

## 2021-08-14 ENCOUNTER — Encounter: Payer: Self-pay | Admitting: Orthopaedic Surgery

## 2021-08-14 DIAGNOSIS — Z96652 Presence of left artificial knee joint: Secondary | ICD-10-CM

## 2021-08-14 NOTE — Progress Notes (Signed)
I am doing OK.  He is post total knee by Dr. Ophelia Charter.  He is here for suture removal.  He has no problem.  Wound looks good.  Stables were removed and wound steristripped.  Encounter Diagnosis  Name Primary?   S/P total knee arthroplasty, left Yes   See Dr. Ophelia Charter in one month.  Go to PT.  Call if any problem.  Precautions discussed.  Electronically Signed Darreld Mclean, MD 2/15/202310:25 AM

## 2021-08-15 DIAGNOSIS — M25562 Pain in left knee: Secondary | ICD-10-CM | POA: Diagnosis not present

## 2021-08-19 DIAGNOSIS — M25562 Pain in left knee: Secondary | ICD-10-CM | POA: Diagnosis not present

## 2021-08-21 DIAGNOSIS — M25562 Pain in left knee: Secondary | ICD-10-CM | POA: Diagnosis not present

## 2021-08-26 DIAGNOSIS — M25562 Pain in left knee: Secondary | ICD-10-CM | POA: Diagnosis not present

## 2021-08-29 DIAGNOSIS — M25562 Pain in left knee: Secondary | ICD-10-CM | POA: Diagnosis not present

## 2021-09-02 DIAGNOSIS — M25562 Pain in left knee: Secondary | ICD-10-CM | POA: Diagnosis not present

## 2021-09-05 DIAGNOSIS — M25562 Pain in left knee: Secondary | ICD-10-CM | POA: Diagnosis not present

## 2021-09-09 DIAGNOSIS — M25562 Pain in left knee: Secondary | ICD-10-CM | POA: Diagnosis not present

## 2021-09-12 ENCOUNTER — Other Ambulatory Visit: Payer: Self-pay

## 2021-09-12 ENCOUNTER — Ambulatory Visit (INDEPENDENT_AMBULATORY_CARE_PROVIDER_SITE_OTHER): Payer: No Typology Code available for payment source | Admitting: Orthopaedic Surgery

## 2021-09-12 DIAGNOSIS — M1711 Unilateral primary osteoarthritis, right knee: Secondary | ICD-10-CM | POA: Insufficient documentation

## 2021-09-12 DIAGNOSIS — Z96652 Presence of left artificial knee joint: Secondary | ICD-10-CM

## 2021-09-12 DIAGNOSIS — Z96659 Presence of unspecified artificial knee joint: Secondary | ICD-10-CM | POA: Insufficient documentation

## 2021-09-12 DIAGNOSIS — M25562 Pain in left knee: Secondary | ICD-10-CM | POA: Diagnosis not present

## 2021-09-12 NOTE — Progress Notes (Signed)
? ?  Post-Op Visit Note ?  ?Patient: Thomas [name]           ?Date of Birth: 12-07-1945           ?MRN: [id number] ?Visit Date: 09/12/2021 ?PCP: Monico Blitz, MD ? ? ?Assessment & Plan: Patient still in therapy has full extension can go up on a step without using his hands with his left leg.  Right knee has bone-on-bone changes and he can return in 1 month and with the discuss scheduling for right total knee arthroplasty at that time. ? ?Chief Complaint:  ?Chief Complaint  ?Patient presents with  ? Left Knee - Routine Post Op  ?  07/29/21 (6w 3d) [name], MD ?Left Total Knee Arthroplasty - Left ? ?  ? ?Visit Diagnoses:  ?1. Status post total left knee replacement   ?2. Unilateral primary osteoarthritis, right knee   ? ? ?Plan: Continue left quad strengthening return 1 month to discuss scheduling right total knee arthroplasty.  Patient is very happy with the results of surgery on his left knee. ? ?Follow-Up Instructions: Return in about 1 month (around 10/13/2021).  ? ?Orders:  ?No orders of the defined types were placed in this encounter. ? ?No orders of the defined types were placed in this encounter. ? ? ?Imaging: ?No results found. ? ?PMFS History: ?Patient Active Problem List  ? Diagnosis Date Noted  ? S/P total knee arthroplasty 09/12/2021  ? Unilateral primary osteoarthritis, right knee 09/12/2021  ? ?Past Medical History:  ?Diagnosis Date  ? Acid reflux   ? Anxiety   ? Arthritis   ? Coronary artery disease   ? Hypertension   ? Myocardial infarction [facility])   ? Osteoporosis   ? Pneumonia   ?  ?No family history on file.  ?Past Surgical History:  ?Procedure Laterality Date  ? CARDIAC CATHETERIZATION    ? colonoscopy    ? CORONARY ANGIOPLASTY  04/27/2015  ? DES in LAD, provisional POBA to D1 ostium 04/27/15 [facility]  ? HIP FRACTURE SURGERY Left   ? pins placed  ? TOTAL KNEE ARTHROPLASTY Left 07/29/2021  ? Procedure: LEFT TOTAL KNEE ARTHROPLASTY;  Surgeon: [name], MD;  Location: [place];  Service:  Orthopedics;  Laterality: Left;  ? ?Social History  ? ?Occupational History  ? Not on file  ?Tobacco Use  ? Smoking status: Former  ?  Types: Cigarettes  ?  Quit date: 02/07/2020  ?  Years since quitting: 1.5  ? Smokeless tobacco: Never  ?Vaping Use  ? Vaping Use: Never used  ?Substance and Sexual Activity  ? Alcohol use: No  ? Drug use: No  ? Sexual activity: Not on file  ? ? ? ?

## 2021-09-16 DIAGNOSIS — M25562 Pain in left knee: Secondary | ICD-10-CM | POA: Diagnosis not present

## 2021-09-20 DIAGNOSIS — M25562 Pain in left knee: Secondary | ICD-10-CM | POA: Diagnosis not present

## 2021-10-24 ENCOUNTER — Encounter: Payer: Self-pay | Admitting: Orthopaedic Surgery

## 2021-10-24 ENCOUNTER — Ambulatory Visit (INDEPENDENT_AMBULATORY_CARE_PROVIDER_SITE_OTHER): Payer: No Typology Code available for payment source | Admitting: Orthopaedic Surgery

## 2021-10-24 VITALS — Ht 70.0 in | Wt 170.0 lb

## 2021-10-24 DIAGNOSIS — Z96652 Presence of left artificial knee joint: Secondary | ICD-10-CM | POA: Diagnosis not present

## 2021-10-24 DIAGNOSIS — M1711 Unilateral primary osteoarthritis, right knee: Secondary | ICD-10-CM | POA: Diagnosis not present

## 2021-10-24 NOTE — Progress Notes (Signed)
? ?Office Visit Note ?  ?Patient: Thomas [name]           ?Date of Birth: 1946-01-26           ?MRN: [id number] ?Visit Date: 10/24/2021 ?             ?Requested by: Kirstie Peri, MD ?[address] ?[place],  [place] [address] ?PCP: Kirstie Peri, MD ? ? ?Assessment & Plan: ?Visit Diagnoses:  ?1. Unilateral primary osteoarthritis, right knee   ?2. Status post total left knee replacement   ? ? ?Plan: Patient would like to proceed with right total knee arthroplasty.  Last time he stayed overnight he like to start outpatient therapy once he is out of the hospital.  He states he has transportation arranged.  Dr. Almond Lint is his PCP.  Procedure discussed he is familiar with total knee arthroplasty since he just had his left knee done by me in January.  Questions were elicited and answered. ? ?Follow-Up Instructions: No follow-ups on file.  ? ?Orders:  ?No orders of the defined types were placed in this encounter. ? ?No orders of the defined types were placed in this encounter. ? ? ? ? Procedures: ?No procedures performed ? ? ?Clinical Data: ?No additional findings. ? ? ?Subjective: ?Chief Complaint  ?Patient presents with  ? Right Knee - Follow-up, Pain  ? Left Knee - Routine Post Op  ? ? ?HPI 76 year old male returns post left total knee arthroplasty July 29, 2021.  Left knee doing great he is off his cane not using the walker states he is ready to proceed with right knee arthroplasty.  Right knee has bone-on-bone medial compartment with 15 degree varus deformity and 1 cm medial subluxation femur on tibia with tricompartmental degenerative changes.  He has had anti-inflammatories injections in the past.  Dr. Lyman Bishop his PCP.  He states he wants to skip home health PT and just do outpatient PT since last time they did not show up for a week and he may better progress with outpatient therapy at the [facility] than he did with home PT. ? ?Review of Systems no history of acid reflux anxiety bladder problems Heart disease  osteoporosis pneumonia.  Previous cardiac stents placed at the [facility] 04/27/2015.  Patient is retired quit smoking last year.  Possible high cholesterol hypertension. ?  ? ? ?Objective: ?Vital Signs: Ht 5\' 10"  (1.778 m)   Wt 170 lb (77.1 kg)   BMI 24.39 kg/m?  ? ?Physical Exam ?Constitutional:   ?   Appearance: He is well-developed.  ?HENT:  ?   Head: Normocephalic and atraumatic.  ?   Right Ear: External ear normal.  ?   Left Ear: External ear normal.  ?Eyes:  ?   Pupils: Pupils are equal, round, and reactive to light.  ?Neck:  ?   Thyroid: No thyromegaly.  ?   Trachea: No tracheal deviation.  ?Cardiovascular:  ?   Rate and Rhythm: Normal rate.  ?Pulmonary:  ?   Effort: Pulmonary effort is normal.  ?   Breath sounds: No wheezing.  ?Abdominal:  ?   General: Bowel sounds are normal.  ?   Palpations: Abdomen is soft.  ?Musculoskeletal:  ?   Cervical back: Neck supple.  ?Skin: ?   General: Skin is warm and dry.  ?   Capillary Refill: Capillary refill takes less than 2 seconds.  ?Neurological:  ?   Mental Status: He is alert and oriented to person, place, and time.  ?  Psychiatric:     ?   Behavior: Behavior normal.     ?   Thought Content: Thought content normal.     ?   Judgment: Judgment normal.  ? ? ?Ortho Exam well-healed left total knee arthroplasty incision.  Previous hip pinning done at [facility] with 3 cannulated screws.  Collateral ligament stable left knee.  Right knee varus deformity crepitus with range of motion 2+ knee effusion. ? ?Specialty Comments:  ?No specialty comments available. ? ?Imaging: ?Standing x-rays knees 08/08/2021 showed well-positioned left total knee arthroplasty with staples at that time.  Right knee medial bone-on-bone changes 1 cm subluxation femur on tibia bone-on-bone medial lateral compartment. ? ? ?PMFS History: ?Patient Active Problem List  ? Diagnosis Date Noted  ? S/P total knee arthroplasty 09/12/2021  ? Unilateral primary osteoarthritis, right knee  09/12/2021  ? ?Past Medical History:  ?Diagnosis Date  ? Acid reflux   ? Anxiety   ? Arthritis   ? Coronary artery disease   ? Hypertension   ? Myocardial infarction [facility])   ? Osteoporosis   ? Pneumonia   ?  ?History reviewed. No pertinent family history.  ?Past Surgical History:  ?Procedure Laterality Date  ? CARDIAC CATHETERIZATION    ? colonoscopy    ? CORONARY ANGIOPLASTY  04/27/2015  ? DES in LAD, provisional POBA to D1 ostium 04/27/15 [facility]  ? HIP FRACTURE SURGERY Left   ? pins placed  ? TOTAL KNEE ARTHROPLASTY Left 07/29/2021  ? Procedure: LEFT TOTAL KNEE ARTHROPLASTY;  Surgeon: [name], MD;  Location: [facility] OR;  Service: Orthopedics;  Laterality: Left;  ? ?Social History  ? ?Occupational History  ? Not on file  ?Tobacco Use  ? Smoking status: Former  ?  Types: Cigarettes  ?  Quit date: 02/07/2020  ?  Years since quitting: 1.7  ? Smokeless tobacco: Never  ?Vaping Use  ? Vaping Use: Never used  ?Substance and Sexual Activity  ? Alcohol use: No  ? Drug use: No  ? Sexual activity: Not on file  ? ? ? ? ? ? ?

## 2021-10-31 DIAGNOSIS — M1710 Unilateral primary osteoarthritis, unspecified knee: Secondary | ICD-10-CM | POA: Diagnosis not present

## 2021-10-31 DIAGNOSIS — Z299 Encounter for prophylactic measures, unspecified: Secondary | ICD-10-CM | POA: Diagnosis not present

## 2021-10-31 DIAGNOSIS — I10 Essential (primary) hypertension: Secondary | ICD-10-CM | POA: Diagnosis not present

## 2021-12-09 ENCOUNTER — Telehealth: Payer: Self-pay | Admitting: *Deleted

## 2021-12-09 NOTE — Telephone Encounter (Signed)
Patient called wanting a call back regarding scheduling surgery. I forwarded him to you as well.

## 2021-12-09 NOTE — Telephone Encounter (Signed)
I spoke with patient.  I have faxed request for additional visits/extended referral.  I have not received anything back yet from [place].  We will keep each other updated.

## 2021-12-17 ENCOUNTER — Telehealth: Payer: Self-pay

## 2021-12-17 NOTE — Telephone Encounter (Signed)
Patient wants OP PT post op TKA.  Surgery will be 01/01/22.  He thinks he went to [facility] last time.  He is covered through [place].  Referral is in media tab.

## 2021-12-18 ENCOUNTER — Other Ambulatory Visit: Payer: Self-pay

## 2021-12-18 DIAGNOSIS — M1711 Unilateral primary osteoarthritis, right knee: Secondary | ICD-10-CM

## 2021-12-18 NOTE — Telephone Encounter (Signed)
FYI order in chart for the is MY pt who will have a total knee replacement on 01/01/22 and is looking to have his therapy arranged prior to surgery he has [employer]. Order was just placed in chart. Let me know if there is anything that I can do.

## 2021-12-18 NOTE — Telephone Encounter (Signed)
No HH PT, he wants to go directly to OP PT.

## 2021-12-18 NOTE — Telephone Encounter (Signed)
Do I have to send a referral request to the [place] prior to surgery? Will he have an in home PT after d/c? I do not want to set this up until afterwards if so

## 2021-12-23 ENCOUNTER — Telehealth: Payer: Self-pay

## 2021-12-23 NOTE — Telephone Encounter (Signed)
Received the following message from Carlon in the [place] clinc:  Erie Noe, at [facility] here in [place], wants to know when [name] needs to start PT after his TKA surgery.  Her number is [phone]

## 2021-12-25 NOTE — Progress Notes (Signed)
Surgical Instructions    Your procedure is scheduled on Wednesday July 5th.  Report to [facility] Main Entrance "A" at 10:30 A.M., then check in with the Admitting office.  Call this number if you have problems the morning of surgery:  [phone]   If you have any questions prior to your surgery date call [phone]: Open Monday-Friday 8am-4pm    Remember:  Do not eat after midnight the night before your surgery  You may drink clear liquids until 9:30am the morning of your surgery.   Clear liquids allowed are: Water, Non-Citrus Juices (without pulp), Carbonated Beverages, Clear Tea, Black Coffee ONLY (NO MILK, CREAM OR POWDERED CREAMER of any kind), and Gatorade    Take these medicines the morning of surgery with A SIP OF WATER: metoprolol tartrate (LOPRESSOR) 25 MG tablet omeprazole (PRILOSEC) 20 MG capsule  IF NEEDED acetaminophen (TYLENOL) 500 MG tablet HYDROcodone-acetaminophen (NORCO) 10-325 MG per tablet    As of today, STOP taking any Aspirin (unless otherwise instructed by your surgeon) Voltaren, Aleve, Naproxen, Ibuprofen, Motrin, Advil, Goody's, BC's, all herbal medications, fish oil, and all vitamins.           Do not wear jewelry  Do not wear lotions, powders,colognes, or deodorant. Do not shave 48 hours prior to surgery.  Men may shave face and neck. Do not bring valuables to the hospital. Do not wear nail polish   [facility] is not responsible for any belongings or valuables. .   Do NOT Smoke (Tobacco/Vaping)  24 hours prior to your procedure  If you use a CPAP at night, you may bring your mask for your overnight stay.   Contacts, glasses, hearing aids, dentures or partials may not be worn into surgery, please bring cases for these belongings   For patients admitted to the hospital, discharge time will be determined by your treatment team.   Patients discharged the day of surgery will not be allowed to drive home, and someone needs to stay with them  for 24 hours.   SURGICAL WAITING ROOM VISITATION Patients having surgery or a procedure in a hospital may have two support people. Children under the age of 63 must have an adult with them who is not the patient. They may stay in the waiting area during the procedure and may switch out with other visitors. If the patient needs to stay at the hospital during part of their recovery, the visitor guidelines for inpatient rooms apply.  Please refer to the [facility] website for the visitor guidelines for Inpatients (after your surgery is over and you are in a regular room).       Special instructions:    Oral Hygiene is also important to reduce your risk of infection.  Remember - BRUSH YOUR TEETH THE MORNING OF SURGERY WITH YOUR REGULAR TOOTHPASTE   [facility]- Preparing For Surgery  Before surgery, you can play an important role. Because skin is not sterile, your skin needs to be as free of germs as possible. You can reduce the number of germs on your skin by washing with CHG (chlorahexidine gluconate) Soap before surgery.  CHG is an antiseptic cleaner which kills germs and bonds with the skin to continue killing germs even after washing.     Please do not use if you have an allergy to CHG or antibacterial soaps. If your skin becomes reddened/irritated stop using the CHG.  Do not shave (including legs and underarms) for at least 48 hours prior to first CHG shower.  It is OK to shave your face.  Please follow these instructions carefully.     Shower the NIGHT BEFORE SURGERY and the MORNING OF SURGERY with CHG Soap.   If you chose to wash your hair, wash your hair first as usual with your normal shampoo. After you shampoo, rinse your hair and body thoroughly to remove the shampoo.  Then [employer] and genitals (private parts) with your normal soap and rinse thoroughly to remove soap.  After that Use CHG Soap as you would any other liquid soap. You can apply CHG directly to the skin and wash  gently with a scrungie or a clean washcloth.   Apply the CHG Soap to your body ONLY FROM THE NECK DOWN.  Do not use on open wounds or open sores. Avoid contact with your eyes, ears, mouth and genitals (private parts). Wash Face and genitals (private parts)  with your normal soap.   Wash thoroughly, paying special attention to the area where your surgery will be performed.  Thoroughly rinse your body with warm water from the neck down.  DO NOT shower/wash with your normal soap after using and rinsing off the CHG Soap.  Pat yourself dry with a CLEAN TOWEL.  Wear CLEAN PAJAMAS to bed the night before surgery  Place CLEAN SHEETS on your bed the night before your surgery  DO NOT SLEEP WITH PETS.   Day of Surgery:  Take a shower with CHG soap. Wear Clean/Comfortable clothing the morning of surgery Do not apply any deodorants/lotions.   Remember to brush your teeth WITH YOUR REGULAR TOOTHPASTE.    If you received a COVID test during your pre-op visit, it is requested that you wear a mask when out in public, stay away from anyone that may not be feeling well, and notify your surgeon if you develop symptoms. If you have been in contact with anyone that has tested positive in the last 10 days, please notify your surgeon.    Please read over the following fact sheets that you were given.

## 2021-12-26 ENCOUNTER — Other Ambulatory Visit: Payer: Self-pay

## 2021-12-26 ENCOUNTER — Encounter: Payer: Self-pay | Admitting: Surgery

## 2021-12-26 ENCOUNTER — Ambulatory Visit (INDEPENDENT_AMBULATORY_CARE_PROVIDER_SITE_OTHER): Payer: No Typology Code available for payment source | Admitting: Surgery

## 2021-12-26 ENCOUNTER — Encounter (HOSPITAL_COMMUNITY): Payer: Self-pay

## 2021-12-26 ENCOUNTER — Encounter (HOSPITAL_COMMUNITY)
Admission: RE | Admit: 2021-12-26 | Discharge: 2021-12-26 | Disposition: A | Discharge status: Home / Self Care | Payer: No Typology Code available for payment source | Source: Ambulatory Visit | Attending: Orthopaedic Surgery | Admitting: Orthopaedic Surgery

## 2021-12-26 VITALS — BP 125/70 | HR 66 | Temp 98.3°F | Resp 17 | Ht 70.0 in | Wt 172.9 lb

## 2021-12-26 DIAGNOSIS — M1711 Unilateral primary osteoarthritis, right knee: Secondary | ICD-10-CM | POA: Diagnosis not present

## 2021-12-26 DIAGNOSIS — I252 Old myocardial infarction: Secondary | ICD-10-CM | POA: Diagnosis not present

## 2021-12-26 DIAGNOSIS — Z01818 Encounter for other preprocedural examination: Secondary | ICD-10-CM | POA: Insufficient documentation

## 2021-12-26 DIAGNOSIS — I2510 Atherosclerotic heart disease of native coronary artery without angina pectoris: Secondary | ICD-10-CM | POA: Insufficient documentation

## 2021-12-26 DIAGNOSIS — I10 Essential (primary) hypertension: Secondary | ICD-10-CM | POA: Insufficient documentation

## 2021-12-26 DIAGNOSIS — K219 Gastro-esophageal reflux disease without esophagitis: Secondary | ICD-10-CM | POA: Insufficient documentation

## 2021-12-26 DIAGNOSIS — Z87891 Personal history of nicotine dependence: Secondary | ICD-10-CM | POA: Insufficient documentation

## 2021-12-26 LAB — CBC
HCT: 43 % (ref 39.0–52.0)
Hemoglobin: 13.6 g/dL (ref 13.0–17.0)
MCH: 29.2 pg (ref 26.0–34.0)
MCHC: 31.6 g/dL (ref 30.0–36.0)
MCV: 92.5 fL (ref 80.0–100.0)
Platelets: 257 10*3/uL (ref 150–400)
RBC: 4.65 MIL/uL (ref 4.22–5.81)
RDW: 13.2 % (ref 11.5–15.5)
WBC: 9.5 10*3/uL (ref 4.0–10.5)
nRBC: 0 % (ref 0.0–0.2)

## 2021-12-26 LAB — SURGICAL PCR SCREEN
MRSA, PCR: NEGATIVE
Staphylococcus aureus: NEGATIVE

## 2021-12-26 LAB — BASIC METABOLIC PANEL
Anion gap: 6 (ref 5–15)
BUN: 24 mg/dL — ABNORMAL HIGH (ref 8–23)
CO2: 26 mmol/L (ref 22–32)
Calcium: 9.3 mg/dL (ref 8.9–10.3)
Chloride: 106 mmol/L (ref 98–111)
Creatinine, Ser: 1.19 mg/dL (ref 0.61–1.24)
GFR, Estimated: >60 mL/min (ref >60)
Glucose, Bld: 94 mg/dL (ref 70–99)
Potassium: 4.2 mmol/L (ref 3.5–5.1)
Sodium: 138 mmol/L (ref 135–145)

## 2021-12-26 NOTE — Progress Notes (Signed)
PCP - Kirstie Peri MD Cardiologist - Pt states he used to see a cardiologist at the [facility] in  [place] but was released 2-3 years ago.   PPM/ICD - denies Device Orders -  Rep Notified -   Chest x-ray - na EKG - 07/25/21 Stress Test - 03/2015 ECHO - 10/22/17 Cardiac Cath - 04/28/17  Sleep Study - none CPAP -   Fasting Blood Sugar -  Checks Blood Sugar _____ times a day  Blood Thinner Instructions:na Aspirin Instructions:per surgeon  ERAS Protcol - clear liquids until 0930 PRE-SURGERY Ensure or G2-   COVID TEST- na   Anesthesia review: yes -cardiac history  Patient denies shortness of breath, fever, cough and chest pain at PAT appointment   All instructions explained to the patient, with a verbal understanding of the material. Patient agrees to go over the instructions while at home for a better understanding. Patient also instructed to wear a mask when out in public prior to surgery. The opportunity to ask questions was provided.

## 2021-12-26 NOTE — Progress Notes (Signed)
76 year old white male with history of end-stage DJD right knee and pain comes in for preop evaluation.  Knee symptoms unchanged from previous visit.  He is want to proceed with right total knee replacement as scheduled.  Today history physical performed.  Review of systems negative.  We have received preop medical and cardiac clearance from Dr. Sherryll Burger.  Patient is familiar with what to expect from surgery since he has had left total knee replacement done earlier this year.  All questions answered.

## 2021-12-27 NOTE — Anesthesia Preprocedure Evaluation (Signed)
Anesthesia Evaluation  Patient identified by MRN, date of birth, ID band Patient awake    Reviewed: Allergy & Precautions, NPO status , Patient's Chart, lab work & pertinent test results  Airway Mallampati: II  TM Distance: >3 FB Neck ROM: Full    Dental  (+) Dental Advisory Given   Pulmonary former smoker,    breath sounds clear to auscultation       Cardiovascular hypertension, Pt. on medications + CAD   Rhythm:Regular Rate:Normal     Neuro/Psych negative neurological ROS     GI/Hepatic Neg liver ROS, GERD  ,  Endo/Other  negative endocrine ROS  Renal/GU negative Renal ROS     Musculoskeletal  (+) Arthritis ,   Abdominal   Peds  Hematology negative hematology ROS (+)   Anesthesia Other Findings   Reproductive/Obstetrics                           Lab Results  Component Value Date   WBC 9.5 12/26/2021   HGB 13.6 12/26/2021   HCT 43.0 12/26/2021   MCV 92.5 12/26/2021   PLT 257 12/26/2021   Lab Results  Component Value Date   CREATININE 1.19 12/26/2021   BUN 24 (H) 12/26/2021   NA 138 12/26/2021   K 4.2 12/26/2021   CL 106 12/26/2021   CO2 26 12/26/2021    Anesthesia Physical Anesthesia Plan  ASA: 3  Anesthesia Plan: Spinal   Post-op Pain Management: Regional block* and Ofirmev IV (intra-op)*   Induction:   PONV Risk Score and Plan: 1 and Propofol infusion and Ondansetron  Airway Management Planned: Natural Airway and Simple Face Mask  Additional Equipment:   Intra-op Plan:   Post-operative Plan:   Informed Consent: I have reviewed the patients History and Physical, chart, labs and discussed the procedure including the risks, benefits and alternatives for the proposed anesthesia with the patient or authorized representative who has indicated his/her understanding and acceptance.     Dental advisory given  Plan Discussed with:   Anesthesia Plan Comments:  ( )     Anesthesia Quick Evaluation

## 2021-12-27 NOTE — Progress Notes (Signed)
Anesthesia Chart Review:  Case: [id number] Date/Time: 01/01/22 1215   Procedure: RIGHT TOTAL KNEE ARTHROPLASTY (Right: Knee)   Anesthesia type: Spinal   Pre-op diagnosis: right knee osteoarthritis   [facility] ROOM 08 / [facility]   Surgeons: [name], MD       DISCUSSION: Patient is a 76 year old [name] scheduled for the above procedure.  He is s/p left TKA on 07/29/21.  History includes former smoker (quit 02/07/20), CAD (MI, s/p DES LAD, provisional POBA D1 04/25/15; non-obstructive CAD 04/28/17), HTN, anxiety, GERD, osteoarthritis (left TKA 07/29/21).    PCP is Thomas Peri, MD. He cleared patient for procedure from both a medical and cardiac standpoint.    He is not routinely followed by cardiologist. Per 06/02/18 [facility] note by Randa Ngo, PA, CAD was stable and EF recovered. He was released back to primary care for follow-up, with consideration of re-evaluation for new symptoms or for some procedures. [facility] PCP did consult cardiology on 09/04/20 for atypical chest symptoms and brief palpitations. [facility] PCP inquired about televisit versus stress test. Cardiologist Dr. Opal Sidles, felt the "symptoms sound non-cardiac, however an exercise stress test is reasonable to pursue." ETT had been planned, but due to known chronic LBBB, order was changed to Adenosine Myoview, but when he arrived for the 11/01/20 stress test, he refused adenosine due to how poorly it made him feel with previous stress test. He was evaluated by Thomas Cave, PA-C/[name], Rajesh, MD (attending) and underwent an exercise ETT with modified Bruce  Protocol and nuclear imaging. Study did not reproduce any atypical chest pain symptoms and showed no definite ischemia (to HR of 97 bpm), but results were limited since he did not meet THR.  EF ~ 55%. Attending Thomas Maid, MD attested to results.   He denied chest pain and SOB at PAT RN visit. He tolerated similar procedure six months ago, and has clearance as  above from Dr. Sherryll [name]. Anesthesia team to evaluate on the day of surgery.    VS: BP 125/70   Pulse 66   Temp 36.8 C   Resp 17   Ht 5\' 10"  (1.778 m)   Wt 78.4 kg   SpO2 98%   BMI 24.81 kg/m    PROVIDERS: , MD is PCP [name]). His PCP at the [name] is Thomas [name], Thomas [name]. Last [facility] cardiology visit noted was from 06/02/18 with 14/4/19, PA.  CAD was stable, and EF recovered. Note suggests he may require cardiology future follow-up if new symptoms develop and/or for some procedures, but otherwise he was released back to primary care for on-going follow-up.     LABS: Labs reviewed: Acceptable for surgery. (all labs ordered are listed, but only abnormal results are displayed)  Labs Reviewed  BASIC METABOLIC PANEL - Abnormal; Notable for the following components:      Result Value   BUN 24 (*)    All other components within normal limits  SURGICAL PCR SCREEN  CBC    IMAGES: CXR 07/25/21:  FINDINGS: - Cardiomediastinal silhouette unchanged in size and contour. No evidence of central vascular congestion. No interlobular septal thickening. - Improved aeration at left lung base. No evidence of confluent airspace disease. - No pneumothorax or pleural effusion. Coarsened interstitial markings. - No acute displaced fracture. Degenerative changes of the spine. - Scoliotic curvature of the spine IMPRESSION: Improved aeration of the lungs, with no definite evidence of acute cardiopulmonary disease     EKG: 07/25/21:  Normal sinus  rhythm Left bundle branch block (old) - [facility] records indicate LBBB was first noted in 2010.     CV: See Media tab, Correspondence, Encounter Date 07/29/21:  Exercise Nuclear Myocardial Perfusion Stress Test (he refused adenosine due to how it made him feel with previous stress test) 11/01/20: Findings:  Stress/rest SPECT scintigraphy:  - Review of the stress 3-plane reconstruction scintigrams reveals a  subtle reversible inferoseptal  wall perfusion defect, possibly  related to known left bundle branch block.  - The left ventricle is of approximately normal size with an  estimated LV EDV of 85 mL. A gated study suggests that the left  ventricular ejection fraction is approximately normal with an  estimated LV EF of 55%.  - No regional wall motion abnormalities are present and the walls  thicken normally.  - Attenuation correction CT images demonstrate a stent in the  region of the LAD, otherwise moderate right coronary artery  calcifications. Stable 4 mm left lower lobe pulmonary nodule on  series 1, image 10.  Impression: 1. Target heart rate not achieved. The patient's usual clinical  syndrome of atypical chest pain was not reproduced during stress  (to a heart rate of 97 bpm). Within this limitation, no definite  imaging evidence of stress-induced ischemia.  2. Please also see separate same-day cardiology stress test  consult note.      MUGA Scan 11/05/17 [facility]):  Impression: Normal rest MUGA. EF 61%. HR 71. BP 123/63. Wall motion was normal in all projections.     Echocardiogram 10/22/17 [facility]):  Left ventricle is normal in size.  There is mild left ventricular hypertrophy.  Ejection fraction 45%.  Grade 1 relaxation abnormality is noted (E/A reversal pattern).  Septal motion is consistent with conduction abnormality.  There is mild global hypokinesis of the left ventricle.  RVSP 16 mmHg.  Compared to prior report on 05/20/2017: There is no significant change.  The study was technically limited.  There is mild global hypokinesis of the left ventricle.  EF 45%.  This was a technically difficult study and if an accurate measure of EF is required, recommend MRI or MUGA. - Comparison [facility] echo 07/20/16: LVEF 35%, moderate global LV dysfunction, grade 1 diastolic dysfunction, normal RV size and function aortic root 3.8 cm.     Holter Monitor 04/23/17-11/1/8 [name]): Sinus rhythm throughout the recording with periods of  sinus bradycardia and sinus tachycardia.  Ventricular ectopy consisted of isolated PVCs (9) and 1 couplet.  Supraventricular ectopy consisted of isolated PACs (40), bigeminal cycles (25) and one 13 beat run with a rate of 140 bpm at 22:35 hrs. on 04/23/2017.  There were no pauses greater than 2 seconds.  Diary entries were associated with sinus rhythm, sinus rhythm with PACs, or sinus tachycardia.     Cardiac cath 04/28/17 [name]): Left main: 30% Proximal LAD 30%.  Mid LAD 30%. 1st Diagonal stent across. Proximal circumflex 30%. Proximal RCA 20%.  Mid RCA 40%.  Distal RCA 20%. Summary: Nonobstructive CAD Dominance: Right dominant Final diagnosis: 1.  Diffuse nonobstructive CAD without clear culprit for unstable angina. 2.  No notable disease that requires intervention at this time as he appears stable from cath 2 years ago. Recommendations: Continue medical management.     PCI 04/27/15 [name]):  Final results: Reasonable angiographic result with PCI of LAD/D1 bifurcation that actually also crosses over D2 origin, performed with direct stent technique from the RRA approach.  Single stent technique with Xience Alpine DES in LAD, provisional POBA to  D1 ostium, the POT of proximal LAD stent more suitable to size of ectatic inflow segment. - Done for abnormal Adenosine MPI 03/23/15: Abnormal myocardial perfusion scintigraphy suggesting prior myocardial infarction with possible [name]-infarct ischemia in LAD distribution, LVEF 44%, septal akinesis and apical hypokinesis. Wall motion abnormalities would be consistent with LBBB, but cannot exclude myocardial infarction.    Past Medical History:  Diagnosis Date   Acid reflux    Anxiety    Arthritis    Coronary artery disease    Hypertension    Myocardial infarction [name])    Osteoporosis    Pneumonia     Past Surgical History:  Procedure Laterality Date   CARDIAC CATHETERIZATION     colonoscopy     CORONARY ANGIOPLASTY  04/27/2015   DES in  LAD, provisional POBA to D1 ostium 04/27/15 [facility]   HIP FRACTURE SURGERY Left    pins placed   JOINT REPLACEMENT     TOTAL KNEE ARTHROPLASTY Left 07/29/2021   Procedure: LEFT TOTAL KNEE ARTHROPLASTY;  Surgeon: [name], MD;  Location: [facility];  Service: Orthopedics;  Laterality: Left;    MEDICATIONS:  acetaminophen (TYLENOL) 500 MG tablet   aspirin EC 325 MG EC tablet   aspirin EC 81 MG tablet   atorvastatin (LIPITOR) 80 MG tablet   Azelaic Acid 15 % gel   calcipotriene (DOVONOX) 0.005 % cream   chlorhexidine (HIBICLENS) 4 % external liquid   clindamycin (CLEOCIN T) 1 % lotion   clobetasol cream (TEMOVATE) 0.05 %   diclofenac Sodium (VOLTAREN) 1 % GEL   HYDROcodone-acetaminophen (NORCO) 10-325 MG per tablet   methocarbamol (ROBAXIN) 500 MG tablet   metoprolol tartrate (LOPRESSOR) 25 MG tablet   nystatin (NYAMYC) powder   omeprazole (PRILOSEC) 20 MG capsule   oxyCODONE-acetaminophen (PERCOCET) 5-325 MG tablet   Salicylic Acid 6 % SHAM   sildenafil (VIAGRA) 50 MG tablet   tamsulosin (FLOMAX) 0.4 MG CAPS capsule   tretinoin (RETIN-A) 0.1 % cream   No current facility-administered medications for this encounter.  Advised to follow surgeon recommendations regarding perioperative ASA.   Shonna Chock, PA-C Surgical Short Stay/Anesthesiology [facility] Phone [phone] [place] [name] Phone [phone] 12/27/2021 11:44 AM

## 2022-01-01 ENCOUNTER — Other Ambulatory Visit: Payer: Self-pay

## 2022-01-01 ENCOUNTER — Encounter (HOSPITAL_COMMUNITY)
Admission: RE | Disposition: A | Discharge status: Home / Self Care | Payer: Self-pay | Source: Ambulatory Visit | Attending: Orthopaedic Surgery

## 2022-01-01 ENCOUNTER — Encounter (HOSPITAL_COMMUNITY): Payer: Self-pay | Admitting: Orthopaedic Surgery

## 2022-01-01 ENCOUNTER — Ambulatory Visit (HOSPITAL_BASED_OUTPATIENT_CLINIC_OR_DEPARTMENT_OTHER): Payer: No Typology Code available for payment source | Admitting: Certified Registered"

## 2022-01-01 ENCOUNTER — Observation Stay (HOSPITAL_COMMUNITY)
Admission: RE | Admit: 2022-01-01 | Discharge: 2022-01-02 | Disposition: A | Discharge status: Home / Self Care | Payer: No Typology Code available for payment source | Source: Ambulatory Visit | Attending: Orthopaedic Surgery | Admitting: Orthopaedic Surgery

## 2022-01-01 ENCOUNTER — Ambulatory Visit (HOSPITAL_COMMUNITY): Payer: No Typology Code available for payment source | Admitting: Vascular Surgery

## 2022-01-01 DIAGNOSIS — Z96652 Presence of left artificial knee joint: Secondary | ICD-10-CM | POA: Insufficient documentation

## 2022-01-01 DIAGNOSIS — I10 Essential (primary) hypertension: Secondary | ICD-10-CM

## 2022-01-01 DIAGNOSIS — I2510 Atherosclerotic heart disease of native coronary artery without angina pectoris: Secondary | ICD-10-CM | POA: Insufficient documentation

## 2022-01-01 DIAGNOSIS — M1711 Unilateral primary osteoarthritis, right knee: Secondary | ICD-10-CM

## 2022-01-01 DIAGNOSIS — Z87891 Personal history of nicotine dependence: Secondary | ICD-10-CM | POA: Diagnosis not present

## 2022-01-01 DIAGNOSIS — I252 Old myocardial infarction: Secondary | ICD-10-CM | POA: Diagnosis not present

## 2022-01-01 HISTORY — PX: TOTAL KNEE ARTHROPLASTY: SHX125

## 2022-01-01 SURGERY — ARTHROPLASTY, KNEE, TOTAL
Anesthesia: Spinal | Site: Knee | Laterality: Right

## 2022-01-01 MED ORDER — PHENOL 1.4 % MT LIQD: 1.0000 | OROMUCOSAL | Status: DC | PRN

## 2022-01-01 MED ORDER — BUPIVACAINE LIPOSOME 1.3 % IJ SUSP: 20.0000 mL | Freq: Once | INTRAMUSCULAR | Status: DC

## 2022-01-01 MED ORDER — 0.9 % SODIUM CHLORIDE (POUR BTL) OPTIME
TOPICAL | Status: DC | PRN
  Administered 2022-01-01: 1000 mL

## 2022-01-01 MED ORDER — ACETAMINOPHEN 10 MG/ML IV SOLN
INTRAVENOUS | Status: AC
  Filled 2022-01-01: qty 100

## 2022-01-01 MED ORDER — LIDOCAINE 2% (20 MG/ML) 5 ML SYRINGE
INTRAMUSCULAR | Status: DC | PRN
  Administered 2022-01-01: 40 mg via INTRAVENOUS

## 2022-01-01 MED ORDER — BUPIVACAINE HCL (PF) 0.25 % IJ SOLN
INTRAMUSCULAR | Status: AC
  Filled 2022-01-01: qty 30

## 2022-01-01 MED ORDER — PROPOFOL 1000 MG/100ML IV EMUL
INTRAVENOUS | Status: AC
  Filled 2022-01-01: qty 200

## 2022-01-01 MED ORDER — ONDANSETRON HCL 4 MG/2ML IJ SOLN
INTRAMUSCULAR | Status: DC | PRN
  Administered 2022-01-01: 4 mg via INTRAVENOUS

## 2022-01-01 MED ORDER — ACETAMINOPHEN 500 MG PO TABS
1000.0000 mg | ORAL_TABLET | Freq: Once | ORAL | Status: AC
  Administered 2022-01-01: 1000 mg via ORAL
  Filled 2022-01-01: qty 2

## 2022-01-01 MED ORDER — CEFAZOLIN SODIUM-DEXTROSE 2-4 GM/100ML-% IV SOLN
INTRAVENOUS | Status: AC
  Filled 2022-01-01: qty 100

## 2022-01-01 MED ORDER — SODIUM CHLORIDE 0.9 % IR SOLN
Status: DC | PRN
  Administered 2022-01-01: 3000 mL

## 2022-01-01 MED ORDER — FENTANYL CITRATE (PF) 250 MCG/5ML IJ SOLN
INTRAMUSCULAR | Status: AC
  Filled 2022-01-01: qty 5

## 2022-01-01 MED ORDER — MENTHOL 3 MG MT LOZG: 1.0000 | LOZENGE | OROMUCOSAL | Status: DC | PRN

## 2022-01-01 MED ORDER — TRANEXAMIC ACID-NACL 1000-0.7 MG/100ML-% IV SOLN
1000.0000 mg | INTRAVENOUS | Status: AC
  Administered 2022-01-01: 1000 mg via INTRAVENOUS
  Filled 2022-01-01: qty 100

## 2022-01-01 MED ORDER — ORAL CARE MOUTH RINSE: 15.0000 mL | Freq: Once | OROMUCOSAL | Status: AC

## 2022-01-01 MED ORDER — OXYCODONE HCL 5 MG PO TABS
5.0000 mg | ORAL_TABLET | ORAL | Status: DC | PRN
  Administered 2022-01-01 – 2022-01-02 (×3): 10 mg via ORAL
  Filled 2022-01-01 (×2): qty 2

## 2022-01-01 MED ORDER — ONDANSETRON HCL 4 MG/2ML IJ SOLN: 4.0000 mg | Freq: Four times a day (QID) | INTRAMUSCULAR | Status: DC | PRN

## 2022-01-01 MED ORDER — MIDAZOLAM HCL 2 MG/2ML IJ SOLN
INTRAMUSCULAR | Status: AC
  Filled 2022-01-01: qty 2

## 2022-01-01 MED ORDER — ASPIRIN 325 MG PO TBEC
325.0000 mg | DELAYED_RELEASE_TABLET | Freq: Every day | ORAL | Status: DC
  Administered 2022-01-02: 325 mg via ORAL
  Filled 2022-01-01: qty 1

## 2022-01-01 MED ORDER — FENTANYL CITRATE (PF) 100 MCG/2ML IJ SOLN: 50.0000 ug | Freq: Once | INTRAMUSCULAR | Status: AC

## 2022-01-01 MED ORDER — CHLORHEXIDINE GLUCONATE 0.12 % MT SOLN: 15.0000 mL | Freq: Once | OROMUCOSAL | Status: AC

## 2022-01-01 MED ORDER — METOPROLOL TARTRATE 25 MG PO TABS
25.0000 mg | ORAL_TABLET | Freq: Two times a day (BID) | ORAL | Status: DC
  Administered 2022-01-01 – 2022-01-02 (×2): 25 mg via ORAL
  Filled 2022-01-01 (×2): qty 1

## 2022-01-01 MED ORDER — METOCLOPRAMIDE HCL 5 MG PO TABS
5.0000 mg | ORAL_TABLET | Freq: Three times a day (TID) | ORAL | Status: DC | PRN
  Administered 2022-01-02: 10 mg via ORAL
  Filled 2022-01-01: qty 2

## 2022-01-01 MED ORDER — METHOCARBAMOL 500 MG PO TABS
500.0000 mg | ORAL_TABLET | Freq: Four times a day (QID) | ORAL | Status: DC | PRN
  Administered 2022-01-01 – 2022-01-02 (×4): 500 mg via ORAL
  Filled 2022-01-01 (×4): qty 1

## 2022-01-01 MED ORDER — BUPIVACAINE LIPOSOME 1.3 % IJ SUSP
INTRAMUSCULAR | Status: DC | PRN
  Administered 2022-01-01: 40 mL

## 2022-01-01 MED ORDER — DEXAMETHASONE SODIUM PHOSPHATE 10 MG/ML IJ SOLN
INTRAMUSCULAR | Status: DC | PRN
  Administered 2022-01-01: 10 mg via INTRAVENOUS

## 2022-01-01 MED ORDER — PHENYLEPHRINE HCL-NACL 20-0.9 MG/250ML-% IV SOLN
INTRAVENOUS | Status: DC | PRN
  Administered 2022-01-01: 30 ug/min via INTRAVENOUS

## 2022-01-01 MED ORDER — HYDROMORPHONE HCL 1 MG/ML IJ SOLN
0.5000 mg | INTRAMUSCULAR | Status: DC | PRN
  Administered 2022-01-01 – 2022-01-02 (×2): 0.5 mg via INTRAVENOUS
  Filled 2022-01-01 (×3): qty 0.5

## 2022-01-01 MED ORDER — POLYETHYLENE GLYCOL 3350 17 G PO PACK: 17.0000 g | PACK | Freq: Every day | ORAL | Status: DC | PRN

## 2022-01-01 MED ORDER — BUPIVACAINE IN DEXTROSE 0.75-8.25 % IT SOLN
INTRATHECAL | Status: DC | PRN
  Administered 2022-01-01: 1.6 mL via INTRATHECAL

## 2022-01-01 MED ORDER — TAMSULOSIN HCL 0.4 MG PO CAPS
0.4000 mg | ORAL_CAPSULE | Freq: Every day | ORAL | Status: DC
Start: 2022-01-01 — End: 2022-01-02
  Administered 2022-01-01: 0.4 mg via ORAL
  Filled 2022-01-01: qty 1

## 2022-01-01 MED ORDER — TRANEXAMIC ACID-NACL 1000-0.7 MG/100ML-% IV SOLN
INTRAVENOUS | Status: AC
  Filled 2022-01-01: qty 200

## 2022-01-01 MED ORDER — CHLORHEXIDINE GLUCONATE 0.12 % MT SOLN
OROMUCOSAL | Status: AC
  Administered 2022-01-01: 15 mL via OROMUCOSAL
  Filled 2022-01-01: qty 15

## 2022-01-01 MED ORDER — ACETAMINOPHEN 325 MG PO TABS: 325.0000 mg | ORAL_TABLET | Freq: Four times a day (QID) | ORAL | Status: DC | PRN

## 2022-01-01 MED ORDER — ROPIVACAINE HCL 5 MG/ML IJ SOLN
INTRAMUSCULAR | Status: DC | PRN
  Administered 2022-01-01: 20 mL via PERINEURAL

## 2022-01-01 MED ORDER — FENTANYL CITRATE (PF) 250 MCG/5ML IJ SOLN
INTRAMUSCULAR | Status: DC | PRN
Start: 2022-01-01 — End: 2022-01-01
  Administered 2022-01-01: 50 ug via INTRAVENOUS

## 2022-01-01 MED ORDER — PROPOFOL 500 MG/50ML IV EMUL
INTRAVENOUS | Status: DC | PRN
  Administered 2022-01-01: 100 ug/kg/min via INTRAVENOUS
  Administered 2022-01-01: 30 mg via INTRAVENOUS

## 2022-01-01 MED ORDER — CLONIDINE HCL (ANALGESIA) 100 MCG/ML EP SOLN
EPIDURAL | Status: DC | PRN
  Administered 2022-01-01: 50 ug

## 2022-01-01 MED ORDER — ONDANSETRON HCL 4 MG PO TABS: 4.0000 mg | ORAL_TABLET | Freq: Four times a day (QID) | ORAL | Status: DC | PRN

## 2022-01-01 MED ORDER — METHOCARBAMOL 1000 MG/10ML IJ SOLN
500.0000 mg | Freq: Four times a day (QID) | INTRAVENOUS | Status: DC | PRN
  Filled 2022-01-01: qty 5

## 2022-01-01 MED ORDER — ATORVASTATIN CALCIUM 80 MG PO TABS
80.0000 mg | ORAL_TABLET | Freq: Every day | ORAL | Status: DC
Start: 2022-01-01 — End: 2022-01-02
  Administered 2022-01-01: 80 mg via ORAL
  Filled 2022-01-01: qty 1

## 2022-01-01 MED ORDER — BUPIVACAINE LIPOSOME 1.3 % IJ SUSP
INTRAMUSCULAR | Status: AC
Start: 2022-01-01 — End: ?
  Filled 2022-01-01: qty 20

## 2022-01-01 MED ORDER — MIDAZOLAM HCL 2 MG/2ML IJ SOLN
INTRAMUSCULAR | Status: DC | PRN
  Administered 2022-01-01: 2 mg via INTRAVENOUS

## 2022-01-01 MED ORDER — AMISULPRIDE (ANTIEMETIC) 5 MG/2ML IV SOLN: 10.0000 mg | Freq: Once | INTRAVENOUS | Status: DC | PRN

## 2022-01-01 MED ORDER — CEFAZOLIN SODIUM-DEXTROSE 2-4 GM/100ML-% IV SOLN
2.0000 g | INTRAVENOUS | Status: AC
  Administered 2022-01-01: 2 g via INTRAVENOUS

## 2022-01-01 MED ORDER — PANTOPRAZOLE SODIUM 40 MG PO TBEC
40.0000 mg | DELAYED_RELEASE_TABLET | Freq: Every day | ORAL | Status: DC
  Administered 2022-01-01 – 2022-01-02 (×2): 40 mg via ORAL
  Filled 2022-01-01 (×2): qty 1

## 2022-01-01 MED ORDER — METOCLOPRAMIDE HCL 5 MG/ML IJ SOLN: 5.0000 mg | Freq: Three times a day (TID) | INTRAMUSCULAR | Status: DC | PRN

## 2022-01-01 MED ORDER — SODIUM CHLORIDE 0.9 % IV SOLN: INTRAVENOUS | Status: DC

## 2022-01-01 MED ORDER — OXYCODONE HCL 5 MG PO TABS
ORAL_TABLET | ORAL | Status: AC
  Filled 2022-01-01: qty 10

## 2022-01-01 MED ORDER — LACTATED RINGERS IV SOLN: INTRAVENOUS | Status: DC

## 2022-01-01 MED ORDER — DOCUSATE SODIUM 100 MG PO CAPS
100.0000 mg | ORAL_CAPSULE | Freq: Two times a day (BID) | ORAL | Status: DC
  Administered 2022-01-01 – 2022-01-02 (×2): 100 mg via ORAL
  Filled 2022-01-01 (×2): qty 1

## 2022-01-01 MED ORDER — FENTANYL CITRATE (PF) 100 MCG/2ML IJ SOLN: 25.0000 ug | INTRAMUSCULAR | Status: DC | PRN

## 2022-01-01 MED ORDER — FENTANYL CITRATE (PF) 100 MCG/2ML IJ SOLN
INTRAMUSCULAR | Status: AC
  Administered 2022-01-01: 50 ug via INTRAVENOUS
  Filled 2022-01-01: qty 2

## 2022-01-01 MED ORDER — METHOCARBAMOL 500 MG PO TABS
ORAL_TABLET | ORAL | Status: AC
  Filled 2022-01-01: qty 1

## 2022-01-01 SURGICAL SUPPLY — 66 items
ATTUNE MED DOME PAT 38 KNEE (Knees) ×1 IMPLANT
ATTUNE PS FEM RT SZ 6 CEM KNEE (Femur) ×1 IMPLANT
ATTUNE PSRP INSR SZ6 6 KNEE (Insert) ×1 IMPLANT
BAG COUNTER SPONGE SURGICOUNT (BAG) ×2 IMPLANT
BANDAGE ESMARK 6X9 LF (GAUZE/BANDAGES/DRESSINGS) ×1 IMPLANT
BASE TIBIAL ROT PLAT SZ 7 KNEE (Knees) IMPLANT
BLADE SAGITTAL 25.0X1.19X90 (BLADE) ×2 IMPLANT
BLADE SAW SGTL 13X75X1.27 (BLADE) ×2 IMPLANT
BNDG ELASTIC 4X5.8 VLCR NS LF (GAUZE/BANDAGES/DRESSINGS) ×2 IMPLANT
BNDG ELASTIC 4X5.8 VLCR STR LF (GAUZE/BANDAGES/DRESSINGS) ×2 IMPLANT
BNDG ELASTIC 6X10 VLCR STRL LF (GAUZE/BANDAGES/DRESSINGS) ×2 IMPLANT
BNDG ESMARK 6X9 LF (GAUZE/BANDAGES/DRESSINGS) ×2
BOWL SMART MIX CTS (DISPOSABLE) ×2 IMPLANT
CEMENT HV SMART SET (Cement) ×4 IMPLANT
COVER SURGICAL LIGHT HANDLE (MISCELLANEOUS) ×2 IMPLANT
CUFF TOURN SGL QUICK 34 (TOURNIQUET CUFF) ×1
CUFF TOURN SGL QUICK 42 (TOURNIQUET CUFF) IMPLANT
CUFF TRNQT CYL 34X4.125X (TOURNIQUET CUFF) ×1 IMPLANT
DRAPE ORTHO SPLIT 77X108 STRL (DRAPES) ×2
DRAPE SURG ORHT 6 SPLT 77X108 (DRAPES) ×2 IMPLANT
DRAPE U-SHAPE 47X51 STRL (DRAPES) ×2 IMPLANT
DRSG MEPILEX BORDER 4X12 (GAUZE/BANDAGES/DRESSINGS) ×1 IMPLANT
DRSG PAD ABDOMINAL 8X10 ST (GAUZE/BANDAGES/DRESSINGS) ×2 IMPLANT
DURAPREP 26ML APPLICATOR (WOUND CARE) ×4 IMPLANT
ELECT REM PT RETURN 9FT ADLT (ELECTROSURGICAL) ×2
ELECTRODE REM PT RTRN 9FT ADLT (ELECTROSURGICAL) ×1 IMPLANT
FACESHIELD WRAPAROUND (MASK) ×4 IMPLANT
FACESHIELD WRAPAROUND OR TEAM (MASK) ×2 IMPLANT
GAUZE XEROFORM 5X9 LF (GAUZE/BANDAGES/DRESSINGS) ×2 IMPLANT
GLOVE BIOGEL PI IND STRL 8 (GLOVE) ×2 IMPLANT
GLOVE BIOGEL PI INDICATOR 8 (GLOVE) ×2
GLOVE ORTHO TXT STRL SZ7.5 (GLOVE) ×4 IMPLANT
GOWN STRL REUS W/ TWL LRG LVL3 (GOWN DISPOSABLE) ×1 IMPLANT
GOWN STRL REUS W/ TWL XL LVL3 (GOWN DISPOSABLE) ×1 IMPLANT
GOWN STRL REUS W/TWL 2XL LVL3 (GOWN DISPOSABLE) ×2 IMPLANT
GOWN STRL REUS W/TWL LRG LVL3 (GOWN DISPOSABLE) ×1
GOWN STRL REUS W/TWL XL LVL3 (GOWN DISPOSABLE) ×1
HANDPIECE INTERPULSE COAX TIP (DISPOSABLE) ×1
IMMOBILIZER KNEE 22 UNIV (SOFTGOODS) ×2 IMPLANT
KIT BASIN OR (CUSTOM PROCEDURE TRAY) ×2 IMPLANT
KIT TURNOVER KIT B (KITS) ×2 IMPLANT
MANIFOLD NEPTUNE II (INSTRUMENTS) ×2 IMPLANT
MARKER SKIN DUAL TIP RULER LAB (MISCELLANEOUS) ×2 IMPLANT
NDL 18GX1X1/2 (RX/OR ONLY) (NEEDLE) ×1 IMPLANT
NDL HYPO 25GX1X1/2 BEV (NEEDLE) ×1 IMPLANT
NEEDLE 18GX1X1/2 (RX/OR ONLY) (NEEDLE) ×2 IMPLANT
NEEDLE HYPO 25GX1X1/2 BEV (NEEDLE) ×2 IMPLANT
NS IRRIG 1000ML POUR BTL (IV SOLUTION) ×2 IMPLANT
PACK TOTAL JOINT (CUSTOM PROCEDURE TRAY) ×2 IMPLANT
PAD ARMBOARD 7.5X6 YLW CONV (MISCELLANEOUS) ×4 IMPLANT
PADDING CAST COTTON 6X4 STRL (CAST SUPPLIES) ×2 IMPLANT
SET HNDPC FAN SPRY TIP SCT (DISPOSABLE) ×1 IMPLANT
STAPLER VISISTAT 35W (STAPLE) IMPLANT
SUCTION FRAZIER HANDLE 10FR (MISCELLANEOUS) ×1
SUCTION TUBE FRAZIER 10FR DISP (MISCELLANEOUS) ×1 IMPLANT
SUT VIC AB 0 CT1 27 (SUTURE) ×1
SUT VIC AB 0 CT1 27XBRD ANBCTR (SUTURE) ×1 IMPLANT
SUT VIC AB 1 CTX 36 (SUTURE) ×2
SUT VIC AB 1 CTX36XBRD ANBCTR (SUTURE) ×2 IMPLANT
SUT VIC AB 2-0 CT1 27 (SUTURE) ×2
SUT VIC AB 2-0 CT1 TAPERPNT 27 (SUTURE) ×2 IMPLANT
SYR 50ML LL SCALE MARK (SYRINGE) ×2 IMPLANT
SYR CONTROL 10ML LL (SYRINGE) ×2 IMPLANT
TIBIAL BASE ROT PLAT SZ 7 KNEE (Knees) ×2 IMPLANT
TOWEL GREEN STERILE (TOWEL DISPOSABLE) ×2 IMPLANT
TOWEL GREEN STERILE FF (TOWEL DISPOSABLE) ×2 IMPLANT

## 2022-01-01 NOTE — Anesthesia Procedure Notes (Signed)
Anesthesia Regional Block: Adductor canal block   Pre-Anesthetic Checklist: , timeout performed,  Correct Patient, Correct Site, Correct Laterality,  Correct Procedure, Correct Position, site marked,  Risks and benefits discussed,  Surgical consent,  Pre-op evaluation,  At surgeon's request and post-op pain management  Laterality: Right  Prep: chloraprep       Needles:  Injection technique: Single-shot  Needle Type: Echogenic Needle     Needle Length: 9cm  Needle Gauge: 21     Additional Needles:   Procedures:,,,, ultrasound used (permanent image in chart),,    Narrative:  Start time: 01/01/2022 11:00 AM End time: 01/01/2022 11:08 AM Injection made incrementally with aspirations every 5 mL.  Performed by: Personally  Anesthesiologist: Marcene Duos, MD

## 2022-01-01 NOTE — Progress Notes (Signed)
Orthopedic Tech Progress Note Patient Details:  Thomas [name] September 21, 1945 [id number]  CPM Right Knee CPM Right Knee: On Right Knee Flexion (Degrees): 0 Right Knee Extension (Degrees): 90  Post Interventions Patient Tolerated: Well Instructions Provided: Care of device  [name] 01/01/2022, 3:43 PM

## 2022-01-01 NOTE — Plan of Care (Signed)

## 2022-01-01 NOTE — H&P (Signed)
TOTAL KNEE ADMISSION H&P  Patient is being admitted for right total knee arthroplasty.  Subjective:  Chief Complaint:right knee pain.  HPI: Thomas [name], 76 y.o. male, has a history of pain and functional disability in the right knee due to arthritis and has failed non-surgical conservative treatments for greater than 12 weeks to includeNSAID's and/or analgesics, corticosteriod injections, use of assistive devices, and weight reduction as appropriate.  Onset of symptoms was gradual, starting 10 years ago with gradually worsening course since that time. .  Patient currently rates pain in the right knee(s) at 10 out of 10 with activity. Patient has night pain, worsening of pain with activity and weight bearing, pain that interferes with activities of daily living, pain with passive range of motion, crepitus, and joint swelling.  Patient has evidence of subchondral sclerosis, periarticular osteophytes, and joint space narrowing by imaging studies. . There is no active infection.  Patient Active Problem List   Diagnosis Date Noted   S/P total knee arthroplasty 09/12/2021   Unilateral primary osteoarthritis, right knee 09/12/2021   Past Medical History:  Diagnosis Date   Acid reflux    Anxiety    Arthritis    Coronary artery disease    Hypertension    Myocardial infarction [facility])    Osteoporosis    Pneumonia     Past Surgical History:  Procedure Laterality Date   CARDIAC CATHETERIZATION     colonoscopy     CORONARY ANGIOPLASTY  04/27/2015   DES in LAD, provisional POBA to D1 ostium 04/27/15 [facility]   HIP FRACTURE SURGERY Left    pins placed   JOINT REPLACEMENT     TOTAL KNEE ARTHROPLASTY Left 07/29/2021   Procedure: LEFT TOTAL KNEE ARTHROPLASTY;  Surgeon: [name], MD;  Location: [facility];  Service: Orthopedics;  Laterality: Left;    Current Facility-Administered Medications  Medication Dose Route Frequency Provider Last Rate Last Admin   acetaminophen (TYLENOL) tablet 1,000  mg  1,000 mg Oral Once Marcene Duos, MD       bupivacaine liposome (EXPAREL) 1.3 % injection 266 mg  20 mL Infiltration Once Zonia Kief M, PA-C       ceFAZolin (ANCEF) 2-4 GM/100ML-% IVPB            ceFAZolin (ANCEF) IVPB 2g/100 mL premix  2 g Intravenous On Call to OR Naida Sleight, PA-C       lactated ringers infusion   Intravenous Continuous Stoltzfus, Nelle Don, DO       tranexamic acid (CYKLOKAPRON) IVPB 1,000 mg  1,000 mg Intravenous To OR Naida Sleight, PA-C       Allergies  Allergen Reactions   Simvastatin Other (See Comments)    Throat swelling    Social History   Tobacco Use   Smoking status: Former    Types: Cigarettes    Quit date: 02/07/2020    Years since quitting: 1.9   Smokeless tobacco: Never  Substance Use Topics   Alcohol use: No    History reviewed. No pertinent family history.   Review of Systems  Constitutional:  Positive for activity change.  HENT: Negative.    Respiratory: Negative.    Cardiovascular: Negative.   Gastrointestinal: Negative.   Genitourinary: Negative.   Musculoskeletal:  Positive for gait problem.  Psychiatric/Behavioral: Negative.      Objective:  Physical Exam HENT:     Head: Normocephalic and atraumatic.     Nose: Nose normal.  Eyes:     Extraocular Movements: Extraocular movements  intact.  Cardiovascular:     Rate and Rhythm: Normal rate and regular rhythm.     Heart sounds: No murmur heard. Pulmonary:     Effort: Pulmonary effort is normal. No respiratory distress.     Breath sounds: Normal breath sounds.  Abdominal:     General: Bowel sounds are normal. There is no distension.  Musculoskeletal:     Cervical back: Normal range of motion.  Neurological:     Mental Status: He is alert and oriented to person, place, and time.  Psychiatric:        Mood and Affect: Mood normal.     Vital signs in last 24 hours: Temp:  [98.1 F (36.7 C)] 98.1 F (36.7 C) (07/05 1027) Pulse Rate:  [55-60] 58 (07/05  1125) Resp:  [10-18] 13 (07/05 1125) BP: (128-145)/(65-77) 144/75 (07/05 1125) SpO2:  [98 %-100 %] 99 % (07/05 1125) Weight:  [78.5 kg] 78.5 kg (07/05 1027)  Labs:   Estimated body mass index is 24.82 kg/m as calculated from the following:   Height as of this encounter: 5\' 10"  (1.778 m).   Weight as of this encounter: 78.5 kg.   Imaging Review Plain radiographs demonstrate moderate degenerative joint disease of the right knee(s). The overall alignment ismild varus. The bone quality appears to be good for age and reported activity level.      Assessment/Plan:  End stage arthritis, right knee   The patient history, physical examination, clinical judgment of the provider and imaging studies are consistent with end stage degenerative joint disease of the right knee(s) and total knee arthroplasty is deemed medically necessary. The treatment options including medical management, injection therapy arthroscopy and arthroplasty were discussed at length. The risks and benefits of total knee arthroplasty were presented and reviewed. The risks due to aseptic loosening, infection, stiffness, patella tracking problems, thromboembolic complications and other imponderables were discussed. The patient acknowledged the explanation, agreed to proceed with the plan and consent was signed. Patient is being admitted for inpatient treatment for surgery, pain control, PT, OT, prophylactic antibiotics, VTE prophylaxis, progressive ambulation and ADL's and discharge planning. The patient is planning to be discharged home with home health services     Patient's anticipated LOS is less than 2 midnights, meeting these requirements: - Younger than 64 - Lives within 1 hour of care - Has a competent adult at home to recover with post-op recover - NO history of  - Chronic pain requiring opiods  - Diabetes  - Coronary Artery Disease  - Heart failure  - Heart attack  - Stroke  - DVT/VTE  - Cardiac  arrhythmia  - Respiratory Failure/COPD  - Renal failure  - Anemia  - Advanced Liver disease

## 2022-01-01 NOTE — Anesthesia Procedure Notes (Signed)
Spinal  Patient location during procedure: OR Start time: 01/01/2022 12:57 PM End time: 01/01/2022 1:01 PM Reason for block: surgical anesthesia Staffing Performed: anesthesiologist  Anesthesiologist: Marcene Duos, MD Performed by: Marcene Duos, MD Authorized by: Marcene Duos, MD   Preanesthetic Checklist Completed: patient identified, IV checked, site marked, risks and benefits discussed, surgical consent, monitors and equipment checked, pre-op evaluation and timeout performed Spinal Block Patient position: sitting Prep: DuraPrep Patient monitoring: heart rate, cardiac monitor, continuous pulse ox and blood pressure Approach: midline Location: L4-5 Injection technique: single-shot Needle Needle type: Pencan  Needle gauge: 24 G Needle length: 9 cm Assessment Sensory level: T4 Events: CSF return

## 2022-01-01 NOTE — Interval H&P Note (Signed)
History and Physical Interval Note:  01/01/2022 12:49 PM  Thomas [name]  has presented today for surgery, with the diagnosis of right knee osteoarthritis.  The various methods of treatment have been discussed with the patient and family. After consideration of risks, benefits and other options for treatment, the patient has consented to  Procedure(s): RIGHT TOTAL KNEE ARTHROPLASTY (Right) as a surgical intervention.  The patient's history has been reviewed, patient examined, no change in status, stable for surgery.  I have reviewed the patient's chart and labs.  Questions were answered to the patient's satisfaction.     [name]

## 2022-01-01 NOTE — Transfer of Care (Signed)
Immediate Anesthesia Transfer of Care Note  Patient: Thomas [name]  Procedure(s) Performed: RIGHT TOTAL KNEE ARTHROPLASTY (Right: Knee)  Patient Location: PACU  Anesthesia Type:Spinal  Level of Consciousness: awake, alert  and oriented  Airway & Oxygen Therapy: Patient Spontanous Breathing  Post-op Assessment: Report given to RN and Post -op Vital signs reviewed and stable  Post vital signs: Reviewed and stable  Last Vitals:  Vitals Value Taken Time  BP 132/79 01/01/22 1445  Temp    Pulse 65 01/01/22 1446  Resp 14 01/01/22 1446  SpO2 98 % 01/01/22 1446  Vitals shown include unvalidated device data.  Last Pain:  Vitals:   01/01/22 1120  TempSrc:   PainSc: 0-No pain         Complications: No notable events documented.

## 2022-01-01 NOTE — Op Note (Signed)
Pre and postop diagnosis: Right knee primary osteoarthritis  Procedure: Right total knee arthroplasty  Surgeon: Annell Greening, MD  Assistant: Zonia Kief, PA-C medically necessary and present for entire procedure  Tourniquet time 46 minutes x 350  Anesthesia preoperative abductor block plus spinal plus Exparel and Marcaine 20+28  Implants:Implants  CEMENT HV SMART SET - [id number]  Inventory Item: CEMENT HV SMART SET Serial no.:  Model/Cat no.: [id number]  Implant name: CEMENT HV SMART SET - [id number] Laterality: Right Area: Knee  Manufacturer: [employer] Date of Manufacture:    Action: Implanted Number Used: 1   Device Identifier:  Device Identifier Type:     CEMENT HV SMART SET - [id number]  Inventory Item: CEMENT HV SMART SET Serial no.:  Model/Cat no.: [id number]  Implant name: CEMENT HV SMART SET - [id number] Laterality: Right Area: Knee  Manufacturer: [employer] Date of Manufacture:    Action: Implanted Number Used: 1   Device Identifier:  Device Identifier Type:     ATTUNE PSRP INSR SZ6 6 KNEE - [id number]  Inventory Item: ATTUNE PSRP INSR SZ6 6 KNEE Serial no.:  Model/Cat no.: [id number]  Implant name: ATTUNE PSRP INSR SZ6 6 KNEE - [id number] Laterality: Right Area: Knee  Manufacturer: [employer] Date of Manufacture:    Action: Implanted Number Used: 1   Device Identifier:  Device Identifier Type:     ATTUNE PS FEM RT SZ 6 CEM KNEE - [id number]  Inventory Item: ATTUNE PS FEM RT SZ 6 CEM KNEE Serial no.:  Model/Cat no.: [id number]  Implant name: ATTUNE PS FEM RT SZ 6 CEM KNEE - [id number] Laterality: Right Area: Knee  Manufacturer: [employer] Date of Manufacture:    Action: Implanted Number Used: 1   Device Identifier:  Device Identifier Type:     ATTUNE MED DOME PAT 38 KNEE - [id number]  Inventory Item: ATTUNE MED DOME PAT 38 KNEE Serial no.:  Model/Cat no.: [id number]  Implant name: ATTUNE MED DOME PAT 38 KNEE - [id number] Laterality: Right Area:  Knee  Manufacturer: [employer] Date of Manufacture:    Action: Implanted Number Used: 1   Device Identifier:  Device Identifier Type:     TIBIAL BASE ROT PLAT SZ 7 KNEE - [id number]  Inventory Item: TIBIAL BASE ROT PLAT SZ 7 KNEE Serial no.:  Model/Cat no.: [id number]  Implant name: TIBIAL BASE ROT PLAT SZ 7 KNEE - [id number] Laterality: Right Area: Knee  Manufacturer: [employer] Date of Manufacture:    Action: Implanted Number Used: 1   Device Identifier:  Device Identifier Type:    Procedure: After induction of spinal anesthesia preoperative block proximal thigh tourniquet lateral post heel bump prepping with DuraPrep 1 g TXA 2 g Ancef prophylaxis timeout procedure completed.  Prep for the tourniquet at the tip of the toes usual impervious stockinette Coban split sheets drapes sterile skin marker and Betadine Steri-Drape sealing the skin was applied.  Leg was wrapped with an Esmarch tourniquet inflated midline incision was made.  Medial parapatellar incision.  There was chronic synovitis present.  Marginal osteophytes bone-on-bone changes medial compartment with completely polished eburnated bone.  Marginal osteophytes were removed.  ACL PCL were resected ACL showed chronic tear.  Meniscal remnants were resected.  Intramedullary hole drilled in the femur 10 mm on the femur 10 resected on the tibia 5 to 6 mm spacer block were tried 6 mm gave nice full extension and was not too tight.  Sizing was a 6 4 femur and 7 on the tibia.  Chamfer cuts trials were inserted good position pulsatile lavage back to mixing of the cement cemented tibia followed by femur placement of permanent rotating poly and then the 3 peg patella.  Patella resection was 10 mm.  Cement was hardened 15 minutes.  Exparel Marcaine was injected while the cement was hardening.  Tourniquet deflation hemostasis obtained then standard layered closure skin staples.  Intermittent #1 Vicryl in the quad tendon and medial retinaculum.   2-0 Vicryl subtenons tissue and superficial retinaculum.  Instrument count needle count was correct.

## 2022-01-01 NOTE — Anesthesia Procedure Notes (Signed)
Procedure Name: MAC Date/Time: 01/01/2022 1:00 PM  Performed by: Anastasio Auerbach, CRNAOxygen Delivery Method: Simple face mask

## 2022-01-02 DIAGNOSIS — M1711 Unilateral primary osteoarthritis, right knee: Secondary | ICD-10-CM | POA: Diagnosis not present

## 2022-01-02 LAB — CBC
HCT: 37.2 % — ABNORMAL LOW (ref 39.0–52.0)
Hemoglobin: 12.1 g/dL — ABNORMAL LOW (ref 13.0–17.0)
MCH: 29.4 pg (ref 26.0–34.0)
MCHC: 32.5 g/dL (ref 30.0–36.0)
MCV: 90.3 fL (ref 80.0–100.0)
Platelets: 192 10*3/uL (ref 150–400)
RBC: 4.12 MIL/uL — ABNORMAL LOW (ref 4.22–5.81)
RDW: 12.7 % (ref 11.5–15.5)
WBC: 12.8 10*3/uL — ABNORMAL HIGH (ref 4.0–10.5)
nRBC: 0 % (ref 0.0–0.2)

## 2022-01-02 LAB — BASIC METABOLIC PANEL
Anion gap: 9 (ref 5–15)
BUN: 20 mg/dL (ref 8–23)
CO2: 24 mmol/L (ref 22–32)
Calcium: 8.7 mg/dL — ABNORMAL LOW (ref 8.9–10.3)
Chloride: 106 mmol/L (ref 98–111)
Creatinine, Ser: 1.04 mg/dL (ref 0.61–1.24)
GFR, Estimated: >60 mL/min (ref >60)
Glucose, Bld: 143 mg/dL — ABNORMAL HIGH (ref 70–99)
Potassium: 3.8 mmol/L (ref 3.5–5.1)
Sodium: 139 mmol/L (ref 135–145)

## 2022-01-02 MED ORDER — METHOCARBAMOL 500 MG PO TABS
500.0000 mg | ORAL_TABLET | Freq: Four times a day (QID) | ORAL | 0 refills | Status: AC | PRN
Start: 2022-01-02 — End: ?

## 2022-01-02 NOTE — Discharge Instructions (Signed)

## 2022-01-02 NOTE — Progress Notes (Addendum)
Patient ID: Thomas [name], male   DOB: 01/15/1946, 76 y.o.   MRN: [id number]   Subjective: 1 Day Post-Op Procedure(s) (LRB): RIGHT TOTAL KNEE ARTHROPLASTY (Right) Patient reports pain as mild and moderate.    Objective: Vital signs in last 24 hours: Temp:  [97.7 F (36.5 C)-98.6 F (37 C)] 97.7 F (36.5 C) (07/06 0356) Pulse Rate:  [54-87] 73 (07/06 0356) Resp:  [10-25] 20 (07/06 0356) BP: (121-154)/(62-79) 136/64 (07/06 0356) SpO2:  [97 %-100 %] 97 % (07/06 0356) Weight:  [78.5 kg] 78.5 kg (07/05 1027)  Intake/Output from previous day: 07/05 0701 - 07/06 0700 In: 2638.7 [P.O.:240; I.V.:1998.7; IV Piggyback:200] Out: 1400 [Urine:1200; Blood:200] Intake/Output this shift: No intake/output data recorded.  Recent Labs    01/02/22 0159  HGB 12.1*   Recent Labs    01/02/22 0159  WBC 12.8*  RBC 4.12*  HCT 37.2*  PLT 192   Recent Labs    01/02/22 0159  NA 139  K 3.8  CL 106  CO2 24  BUN 20  CREATININE 1.04  GLUCOSE 143*  CALCIUM 8.7*   No results for input(s): "LABPT", "INR" in the last 72 hours.  Neurologically intact No results found.  Assessment/Plan: 1 Day Post-Op Procedure(s) (LRB): RIGHT TOTAL KNEE ARTHROPLASTY (Right) Up with therapy, home this afternoon likely as he did with previous TKA.  Need HHPT set up. Previous admission problems with HHPT and they did not see him for one week.   Thomas [name] 01/02/2022, 7:24 AM

## 2022-01-02 NOTE — Progress Notes (Signed)
Physical Therapy Treatment Patient Details Name: Thomas [name] MRN: [id number] DOB: 22-Oct-1945 Today's Date: 01/02/2022   History of Present Illness 76 y/o male admitted on 01/01/22 for R TKA. PMH: hx of L TKA 06/2021, hx of MI, CAD, HTN    PT Comments    Patient progressing well towards physical therapy goals and eager to discharge this PM. Patient ambulating at supervision level with use of RW, cues for heel strike to encourage knee extension. Patient able to negotiate stairs with better quality compared to previous session. Requires encouragement at times to participate due to fear of pain. Encouraged patient to perform HEP at home over weekend in preparation to attend OPPT on 7/10. D/c plan remains appropriate.    Recommendations for follow up therapy are one component of a multi-disciplinary discharge planning process, led by the attending physician.  Recommendations may be updated based on patient status, additional functional criteria and insurance authorization.  Follow Up Recommendations  Follow physician's recommendations for discharge plan and follow up therapies     Assistance Recommended at Discharge Intermittent Supervision/Assistance  Patient can return home with the following A little help with bathing/dressing/bathroom;Assistance with cooking/housework;Help with stairs or ramp for entrance;Assist for transportation   Equipment Recommendations  None recommended by PT    Recommendations for Other Services       Precautions / Restrictions Precautions Precautions: Fall;Knee Precaution Booklet Issued: Yes (comment) Required Braces or Orthoses: Knee Immobilizer - Right Knee Immobilizer - Right: On except when in CPM Restrictions Weight Bearing Restrictions: Yes RLE Weight Bearing: Weight bearing as tolerated     Mobility  Bed Mobility Overal bed mobility: Modified Independent             General bed mobility comments: utilizes UEs to assist R LE in/out of bed     Transfers Overall transfer level: Modified independent Equipment used: Rolling Thomas [name] (2 wheels) Transfers: Sit to/from Stand Sit to Stand: Min guard           General transfer comment: increased time and effort to complete. Cues for hand placement as patient initially unsafe with placement of RW and hands to stand.    Ambulation/Gait Ambulation/Gait assistance: Supervision Gait Distance (Feet): 220 Feet Assistive device: Rolling Thomas [name] (2 wheels) Gait Pattern/deviations: Step-to pattern, Decreased stride length, Decreased stance time - right, Knee flexed in stance - right, Decreased weight shift to right Gait velocity: decreased     General Gait Details: improved gait quality this session. Cues for heel strike. Supervision for safety   Stairs Stairs: Yes Stairs assistance: Min guard Stair Management: One rail Left, Step to pattern, Forwards Number of Stairs: 2 General stair comments: instructed on up with good and down with the bad. Min guard for safety. Patient hesitant to bear weight through R LE to advance L LE onto step. Requires encouragement but patient able to complete   Wheelchair Mobility    Modified Rankin (Stroke Patients Only)       Balance Overall balance assessment: Needs assistance   Sitting balance-Thomas [name] Scale: Good     Standing balance support: Bilateral upper extremity supported, Reliant on assistive device for balance Standing balance-Thomas [name] Scale: Poor Standing balance comment: reliant on RW for support at this time                            Cognition Arousal/Alertness: Awake/alert Behavior During Therapy: [facility] for tasks assessed/performed Overall Cognitive Status: Within Functional Limits for tasks assessed  Exercises      General Comments        Pertinent Vitals/Pain Pain Assessment Pain Assessment: Faces Faces Pain Scale: Hurts even more Pain Location: R  knee Pain Descriptors / Indicators: Sore Pain Intervention(s): Monitored during session, Repositioned    Home Living                          Prior Function            PT Goals (current goals can now be found in the care plan section) Acute Rehab PT Goals Patient Stated Goal: to go home today PT Goal Formulation: With patient Time For Goal Achievement: 01/16/22 Potential to Achieve Goals: Good Progress towards PT goals: Progressing toward goals    Frequency    7X/week      PT Plan Current plan remains appropriate    Co-evaluation              AM-PAC PT "6 Clicks" Mobility   Outcome Measure  Help needed turning from your back to your side while in a flat bed without using bedrails?: None Help needed moving from lying on your back to sitting on the side of a flat bed without using bedrails?: None Help needed moving to and from a bed to a chair (including a wheelchair)?: A Little Help needed standing up from a chair using your arms (e.g., wheelchair or bedside chair)?: A Little Help needed to walk in hospital room?: A Little Help needed climbing 3-5 steps with a railing? : A Little 6 Click Score: 20    End of Session   Activity Tolerance: Patient tolerated treatment well Patient left: in bed;with call bell/phone within reach Nurse Communication: Mobility status PT Visit Diagnosis: Muscle weakness (generalized) (M62.81);Other abnormalities of gait and mobility (R26.89)     Time: 0175-1025 PT Time Calculation (min) (ACUTE ONLY): 13 min  Charges:  $Gait Training: 8-22 mins                     Thomas [name] A. Dan Humphreys PT, DPT Acute Rehabilitation Services Office [phone]    Thomas [name] 01/02/2022, 3:42 PM

## 2022-01-02 NOTE — Evaluation (Signed)
Physical Therapy Evaluation Patient Details Name: Thomas [name] MRN: [id number] DOB: 03-26-1946 Today's Date: 01/02/2022  History of Present Illness  76 y/o male admitted on 01/01/22 for R TKA. PMH: hx of L TKA 06/2021, hx of MI, CAD, HTN  Clinical Impression  Patient admitted following above procedure. PTA, patient lives with wife and was independent prior. Patient presents with functional limitations/deficits listed below. Educated patient on use of knee immobilizer per orders, importance of knee extension, and HEP program, patient verbalized understanding. Patient ambulated total of 100' with RW and min guard. Was able to negotiate 2 stairs with L hand rail, min guard, and encouragement as patient is fearful to bear weight on R LE due to soreness.  Patient will benefit from skilled PT services during acute stay to address listed deficits. Will follow up this afternoon to practice stairs prior to discharge.     Recommendations for follow up therapy are one component of a multi-disciplinary discharge planning process, led by the attending physician.  Recommendations may be updated based on patient status, additional functional criteria and insurance authorization.  Follow Up Recommendations Follow physician's recommendations for discharge plan and follow up therapies      Assistance Recommended at Discharge Intermittent Supervision/Assistance  Patient can return home with the following  A little help with bathing/dressing/bathroom;Assistance with cooking/housework;Help with stairs or ramp for entrance;Assist for transportation    Equipment Recommendations None recommended by PT  Recommendations for Other Services       Functional Status Assessment Patient has had a recent decline in their functional status and demonstrates the ability to make significant improvements in function in a reasonable and predictable amount of time.     Precautions / Restrictions Precautions Precautions:  Fall;Knee Precaution Booklet Issued: Yes (comment) Required Braces or Orthoses: Knee Immobilizer - Right Knee Immobilizer - Right: On except when in CPM Restrictions Weight Bearing Restrictions: Yes RLE Weight Bearing: Weight bearing as tolerated      Mobility  Bed Mobility Overal bed mobility: Modified Independent             General bed mobility comments: utilizes UEs to assist R LE in/out of bed    Transfers Overall transfer level: Needs assistance Equipment used: Rolling Thomas [name] (2 wheels) Transfers: Sit to/from Stand Sit to Stand: Min guard           General transfer comment: increased time and effort to complete. Cues for hand placement as patient initially unsafe with placement of RW and hands to stand.    Ambulation/Gait Ambulation/Gait assistance: Min guard Gait Distance (Feet): 100 Feet (50', stairs, then 50' to return to room) Assistive device: Rolling Thomas [name] (2 wheels) Gait Pattern/deviations: Step-to pattern, Decreased stride length, Decreased stance time - right, Knee flexed in stance - right, Decreased weight shift to right Gait velocity: decreased     General Gait Details: min guard for safety. Cues for heel strike  Stairs Stairs: Yes Stairs assistance: Min guard Stair Management: One rail Left, Step to pattern, Forwards Number of Stairs: 2 General stair comments: instructed on up with good and down with the bad. Min guard for safety. Patient hesitant to bear weight through R LE to advance L LE onto step. Requires encouragement but patient able to complete  Wheelchair Mobility    Modified Rankin (Stroke Patients Only)       Balance Overall balance assessment: Needs assistance   Sitting balance-Leahy Scale: Good     Standing balance support: Bilateral upper extremity supported, Reliant on assistive device  for balance Standing balance-Leahy Scale: Poor Standing balance comment: reliant on RW for support at this time                              Pertinent Vitals/Pain Pain Assessment Pain Assessment: Faces Faces Pain Scale: Hurts even more Pain Location: R knee Pain Descriptors / Indicators: Sore Pain Intervention(s): Monitored during session, Repositioned    Home Living Family/patient expects to be discharged to:: Private residence Living Arrangements: Spouse/significant other Available Help at Discharge: Family;Available 24 hours/day Type of Home: House Home Access: Stairs to enter Entrance Stairs-Rails: Psychiatric nurse of Steps: 2   Home Layout: Two level;Able to live on main level with bedroom/bathroom Home Equipment: Rolling Thomas [name] (2 wheels);Shower seat;Shower seat - built in Additional Comments: Wife doesn't drive - children live nearby and plan to help as needed with transportation, groceries, etc.    Prior Function Prior Level of Function : Independent/Modified Independent             Mobility Comments: Typically indep without DME; intermittent use of SPC if knee painful. Drives. Retired from work. Enjoys walking dog daily       Hand Dominance        Extremity/Trunk Assessment   Upper Extremity Assessment Upper Extremity Assessment: Overall WFL for tasks assessed    Lower Extremity Assessment Lower Extremity Assessment: RLE deficits/detail RLE Deficits / Details: s/p R TKA; 2-/5 quad activation    Cervical / Trunk Assessment Cervical / Trunk Assessment: Normal  Communication   Communication: HOH (slight)  Cognition Arousal/Alertness: Awake/alert Behavior During Therapy: WFL for tasks assessed/performed Overall Cognitive Status: Within Functional Limits for tasks assessed                                          General Comments      Exercises     Assessment/Plan    PT Assessment Patient needs continued PT services  PT Problem List Decreased strength;Decreased range of motion;Decreased activity tolerance;Decreased  balance;Decreased mobility;Pain       PT Treatment Interventions DME instruction;Gait training;Stair training;Functional mobility training;Therapeutic activities;Balance training;Therapeutic exercise;Patient/family education    PT Goals (Current goals can be found in the Care Plan section)  Acute Rehab PT Goals Patient Stated Goal: to go home today PT Goal Formulation: With patient Time For Goal Achievement: 01/16/22 Potential to Achieve Goals: Good    Frequency 7X/week     Co-evaluation               AM-PAC PT "6 Clicks" Mobility  Outcome Measure Help needed turning from your back to your side while in a flat bed without using bedrails?: None Help needed moving from lying on your back to sitting on the side of a flat bed without using bedrails?: None Help needed moving to and from a bed to a chair (including a wheelchair)?: A Little Help needed standing up from a chair using your arms (e.g., wheelchair or bedside chair)?: A Little Help needed to walk in hospital room?: A Little Help needed climbing 3-5 steps with a railing? : A Little 6 Click Score: 20    End of Session         PT Visit Diagnosis: Muscle weakness (generalized) (M62.81);Other abnormalities of gait and mobility (R26.89)    Time: [id number] PT Time Calculation (min) (ACUTE ONLY): 30 min  Charges:   PT Evaluation $PT Eval Low Complexity: 1 Low PT Treatments $Gait Training: 8-22 mins        Thomas [name] A. Dan Humphreys PT, DPT Acute Rehabilitation Services Office [phone]   [name] 01/02/2022, 9:14 AM

## 2022-01-02 NOTE — Progress Notes (Signed)
Patient ID: Thomas [name], male   DOB: 07-Apr-1946, 76 y.o.   MRN: [id number] Patient walked in the hall did a couple steps.  Denies having some problems with increased pain and nausea.  Wants to go to outpatient therapy rather than home physical therapy so we will set this up at [facility] where he did his opposite knee.  He can be discharged this afternoon.

## 2022-01-02 NOTE — Anesthesia Postprocedure Evaluation (Signed)
Anesthesia Post Note  Patient: Thomas [name]  Procedure(s) Performed: RIGHT TOTAL KNEE ARTHROPLASTY (Right: Knee)     Patient location during evaluation: PACU Anesthesia Type: Spinal Level of consciousness: awake and alert Pain management: pain level controlled Vital Signs Assessment: post-procedure vital signs reviewed and stable Respiratory status: spontaneous breathing and respiratory function stable Cardiovascular status: blood pressure returned to baseline and stable Postop Assessment: spinal receding Anesthetic complications: no   No notable events documented.  Last Vitals:  Vitals:   01/01/22 2100 01/02/22 0356  BP: 121/76 136/64  Pulse: 87 73  Resp: 18 20  Temp: 36.9 C 36.5 C  SpO2: 99% 97%    Last Pain:  Vitals:   01/02/22 0651  TempSrc:   PainSc: Ardean Larsen

## 2022-01-07 DIAGNOSIS — E7800 Pure hypercholesterolemia, unspecified: Secondary | ICD-10-CM | POA: Diagnosis not present

## 2022-01-07 DIAGNOSIS — Z96651 Presence of right artificial knee joint: Secondary | ICD-10-CM | POA: Diagnosis not present

## 2022-01-07 DIAGNOSIS — I10 Essential (primary) hypertension: Secondary | ICD-10-CM | POA: Diagnosis not present

## 2022-01-07 DIAGNOSIS — R519 Headache, unspecified: Secondary | ICD-10-CM | POA: Diagnosis not present

## 2022-01-07 DIAGNOSIS — Z299 Encounter for prophylactic measures, unspecified: Secondary | ICD-10-CM | POA: Diagnosis not present

## 2022-01-08 NOTE — Discharge Summary (Signed)
Patient ID: Thomas [name] MRN: [id number] DOB/AGE: September 15, 1945 76 y.o.  Admit date: 01/01/2022 Discharge date: 01/02/2022  Admission Diagnoses:  Principal Problem:   Arthritis of right knee   Discharge Diagnoses:  Principal Problem:   Arthritis of right knee  status post Procedure(s): RIGHT TOTAL KNEE ARTHROPLASTY  Past Medical History:  Diagnosis Date   Acid reflux    Anxiety    Arthritis    Coronary artery disease    Hypertension    Myocardial infarction [facility])    Osteoporosis    Pneumonia     Surgeries: Procedure(s): RIGHT TOTAL KNEE ARTHROPLASTY on 01/01/2022   Consultants:   Discharged Condition: Improved  Hospital Course: Thomas [name] is an 76 y.o. male who was admitted 01/01/2022 for operative treatment of Arthritis of right knee. Patient failed conservative treatments (please see the history and physical for the specifics) and had severe unremitting pain that affects sleep, daily activities and work/hobbies. After pre-op clearance, the patient was taken to the operating room on 01/01/2022 and underwent  Procedure(s): RIGHT TOTAL KNEE ARTHROPLASTY.    Patient was given perioperative antibiotics:  Anti-infectives (From admission, onward)    Start     Dose/Rate Route Frequency Ordered Stop   01/01/22 1200  ceFAZolin (ANCEF) IVPB 2g/100 mL premix        2 g 200 mL/hr over 30 Minutes Intravenous On call to O.R. 01/01/22 1018 01/01/22 1300   01/01/22 1021  ceFAZolin (ANCEF) 2-4 GM/100ML-% IVPB       Note to Pharmacy: Lacie Draft A: cabinet override      01/01/22 1021 01/01/22 1301        Patient was given sequential compression devices and early ambulation to prevent DVT.   Patient benefited maximally from hospital stay and there were no complications. At the time of discharge, the patient was urinating/moving their bowels without difficulty, tolerating a regular diet, pain is controlled with oral pain medications and they have been cleared by PT/OT.   Recent  vital signs: No data found.   Recent laboratory studies: No results for input(s): "WBC", "HGB", "HCT", "PLT", "NA", "K", "CL", "CO2", "BUN", "CREATININE", "GLUCOSE", "INR", "CALCIUM" in the last 72 hours.  Invalid input(s): "PT", "2"   Discharge Medications:   Allergies as of 01/02/2022       Reactions   Simvastatin Other (See Comments)   Throat swelling        Medication List     STOP taking these medications    oxyCODONE-acetaminophen 5-325 MG tablet Commonly known as: Percocet       TAKE these medications    acetaminophen 500 MG tablet Commonly known as: TYLENOL Take 1,000 mg by mouth every 8 (eight) hours as needed for moderate pain.   aspirin EC 325 MG tablet Take 1 tablet (325 mg total) by mouth daily with breakfast. What changed: Another medication with the same name was removed. Continue taking this medication, and follow the directions you see here.   atorvastatin 80 MG tablet Commonly known as: LIPITOR Take 80 mg by mouth at bedtime.   Azelaic Acid 15 % gel Apply 1 Application topically daily as needed (acne).   calcipotriene 0.005 % cream Commonly known as: DOVONOX Apply 1 Application topically 2 (two) times daily as needed (psoriasis).   chlorhexidine 4 % external liquid Commonly known as: HIBICLENS Apply 1 application topically 4 (four) times a week.   clindamycin 1 % lotion Commonly known as: CLEOCIN T Apply 1 application  topically daily as needed (rash).  clobetasol cream 0.05 % Commonly known as: TEMOVATE Apply 1 application topically 2 (two) times daily as needed (psoriasis).   diclofenac Sodium 1 % Gel Commonly known as: VOLTAREN Apply 1 application topically 4 (four) times daily as needed (pain).   HYDROcodone-acetaminophen 10-325 MG tablet Commonly known as: NORCO Take 1 tablet by mouth 3 (three) times daily as needed for moderate pain.   methocarbamol 500 MG tablet Commonly known as: ROBAXIN Take 1 tablet (500 mg total) by  mouth every 6 (six) hours as needed for muscle spasms. What changed:  when to take this reasons to take this   metoprolol tartrate 25 MG tablet Commonly known as: LOPRESSOR Take 25 mg by mouth 2 (two) times daily.   Nyamyc powder Generic drug: nystatin Apply 1 Application topically 3 (three) times daily as needed (itching).   omeprazole 20 MG capsule Commonly known as: PRILOSEC Take 20 mg by mouth daily.   Salicylic Acid 6 % Sham Apply 1 application topically 4 (four) times a week.   sildenafil 50 MG tablet Commonly known as: VIAGRA Take 25 mg by mouth daily.   tamsulosin 0.4 MG Caps capsule Commonly known as: FLOMAX Take 0.4 mg by mouth at bedtime.   tretinoin 0.1 % cream Commonly known as: RETIN-A Apply 1 application  topically daily as needed (acne).        Diagnostic Studies: No results found.     Follow-up Information     [name], MD Follow up in 6 day(s).   Specialty: Orthopedic Surgery Contact information: [address] [place] [address] [phone]                 Discharge Plan:  discharge to home  Disposition:     Signed: Zonia Kief  01/08/2022, 3:20 PM

## 2022-01-09 ENCOUNTER — Ambulatory Visit (INDEPENDENT_AMBULATORY_CARE_PROVIDER_SITE_OTHER): Payer: No Typology Code available for payment source | Admitting: Orthopaedic Surgery

## 2022-01-09 ENCOUNTER — Ambulatory Visit (INDEPENDENT_AMBULATORY_CARE_PROVIDER_SITE_OTHER): Payer: No Typology Code available for payment source

## 2022-01-09 DIAGNOSIS — Z96651 Presence of right artificial knee joint: Secondary | ICD-10-CM

## 2022-01-09 NOTE — Progress Notes (Signed)
   Post-Op Visit Note   Patient: Thomas [name]           Date of Birth: 04-24-1946           MRN: [id number] Visit Date: 01/09/2022 PCP: Kirstie Peri, MD   Assessment & Plan: Post right total knee arthroplasty is already had the left done he is in outpatient therapy has some mild swelling.  New dressing applied return 1 week for staple removal.  Chief Complaint:  Chief Complaint  Patient presents with   Right Knee - Routine Post Op   Visit Diagnoses:  1. Status post total right knee replacement     Plan: New Mepilex applied.  Return in 1 week for staple removal.  Follow-Up Instructions: No follow-ups on file.   Orders:  Orders Placed This Encounter  Procedures   XR Knee 1-2 Views Right   No orders of the defined types were placed in this encounter.   Imaging: XR Knee 1-2 Views Right  Result Date: 01/09/2022 Standing AP both knees lateral right knee obtained and reviewed this shows a well-positioned right total knee arthroplasty with anterior staples.  No subsidence good fit and fill prosthesis. Impression: Satisfactory right total knee arthroplasty.   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  Past Surgical History:  Procedure Laterality Date   CARDIAC CATHETERIZATION     colonoscopy     CORONARY ANGIOPLASTY  04/27/2015   DES in LAD, provisional POBA to D1 ostium 04/27/15 [facility]   HIP FRACTURE SURGERY Left    pins placed   JOINT REPLACEMENT     TOTAL KNEE ARTHROPLASTY Left 07/29/2021   Procedure: LEFT TOTAL KNEE ARTHROPLASTY;  Surgeon: [name], MD;  Location: [facility] OR;  Service:  Orthopedics;  Laterality: Left;   TOTAL KNEE ARTHROPLASTY Right 01/01/2022   Procedure: RIGHT TOTAL KNEE ARTHROPLASTY;  Surgeon: [name], MD;  Location: [facility] OR;  Service: Orthopedics;  Laterality: Right;   Social History   Occupational History   Not on file  Tobacco Use   Smoking status: Former    Types: Cigarettes    Quit date: 02/07/2020    Years since quitting: 1.9   Smokeless tobacco: Never  Vaping Use   Vaping Use: Never used  Substance and Sexual Activity   Alcohol use: No   Drug use: No   Sexual activity: Not on file

## 2022-01-13 ENCOUNTER — Telehealth: Payer: Self-pay | Admitting: Orthopaedic Surgery

## 2022-01-13 NOTE — Telephone Encounter (Signed)
Received call from [facility]. Needs records from 6/29 to present. I faxed [phone] attn: Nurse Jeanella Anton. callback [phone] ext [phone]

## 2022-01-16 ENCOUNTER — Ambulatory Visit (INDEPENDENT_AMBULATORY_CARE_PROVIDER_SITE_OTHER): Payer: No Typology Code available for payment source | Admitting: Orthopaedic Surgery

## 2022-01-16 DIAGNOSIS — Z96651 Presence of right artificial knee joint: Secondary | ICD-10-CM

## 2022-01-20 NOTE — Progress Notes (Signed)
   Post-Op Visit Note   Patient: Thomas [name]           Date of Birth: [birth year]           MRN: [id number] Visit Date: 01/16/2022 PCP: Kirstie Peri, MD   Assessment & Plan: Post total knee arthroplasty right knee.  Patient is taking about 4 Norco a day surgery date was 01/01/2022.  Patient is doing therapy still having problems lifting his leg.  He has flexion 100 degrees has full extension.  Staples removed continue therapy return 4 weeks.  He will continue to work on quad strengthening knee flexion and maintain his full extension.  Chief Complaint:  Chief Complaint  Patient presents with   Right Knee - Routine Post Op   Visit Diagnoses:  1. Status post total right knee replacement     Plan: Continue and transition outpatient therapy recheck 4 weeks.  Follow-Up Instructions: No follow-ups on file.   Orders:  No orders of the defined types were placed in this encounter.  No orders of the defined types were placed in this encounter.   Imaging: No results found.  PMFS History: Patient Active Problem List   Diagnosis Date Noted   Arthritis of right knee 01/01/2022   S/P total knee arthroplasty 09/12/2021   Unilateral primary osteoarthritis, right knee 09/12/2021   Past Medical History:  Diagnosis Date   Acid reflux    Anxiety    Arthritis    Coronary artery disease    Hypertension    Myocardial infarction [facility]    Osteoporosis    Pneumonia     No family history on file.  Past Surgical History:  Procedure Laterality Date   CARDIAC CATHETERIZATION     colonoscopy     CORONARY ANGIOPLASTY  04/27/2015   DES in LAD, provisional POBA to D1 ostium 04/27/15 [facility]   HIP FRACTURE SURGERY Left    pins placed   JOINT REPLACEMENT     TOTAL KNEE ARTHROPLASTY Left 07/29/2021   Procedure: LEFT TOTAL KNEE ARTHROPLASTY;  Surgeon: [name], MD;  Location: [facility];  Service: Orthopedics;  Laterality: Left;   TOTAL KNEE ARTHROPLASTY Right 01/01/2022   Procedure: RIGHT  TOTAL KNEE ARTHROPLASTY;  Surgeon: [name], MD;  Location: [facility];  Service: Orthopedics;  Laterality: Right;   Social History   Occupational History   Not on file  Tobacco Use   Smoking status: Former    Types: Cigarettes    Quit date: 02/07/2020    Years since quitting: 1.9   Smokeless tobacco: Never  Vaping Use   Vaping Use: Never used  Substance and Sexual Activity   Alcohol use: No   Drug use: No   Sexual activity: Not on file

## 2022-01-31 ENCOUNTER — Other Ambulatory Visit: Payer: Self-pay

## 2022-01-31 ENCOUNTER — Telehealth: Payer: Self-pay | Admitting: Orthopaedic Surgery

## 2022-01-31 MED ORDER — AMOXICILLIN 500 MG PO TABS: ORAL_TABLET | ORAL | 0 refills | Status: AC

## 2022-01-31 NOTE — Telephone Encounter (Signed)
Pt is only 1 month out. States he needs to have a tooth extraction. I went ahead and sent in the antibiotic but I advised pt I wanted to discuss this with you first. Are you ok with him having an extraction? Please advise

## 2022-01-31 NOTE — Telephone Encounter (Signed)
Called and advised pt.

## 2022-01-31 NOTE — Telephone Encounter (Signed)
Pt called requesting a call back. Pt need an antibiotic for upcoming appt but just had surgery last week. Please call pt about this matter at [phone].

## 2022-02-13 ENCOUNTER — Ambulatory Visit (INDEPENDENT_AMBULATORY_CARE_PROVIDER_SITE_OTHER): Payer: No Typology Code available for payment source | Admitting: Orthopaedic Surgery

## 2022-02-13 ENCOUNTER — Encounter: Payer: Self-pay | Admitting: Orthopaedic Surgery

## 2022-02-13 VITALS — Ht 70.0 in | Wt 173.0 lb

## 2022-02-13 DIAGNOSIS — Z96651 Presence of right artificial knee joint: Secondary | ICD-10-CM

## 2022-02-13 NOTE — Progress Notes (Signed)
   Post-Op Visit Note   Patient: Thomas [name]           Date of Birth: [birth year]           MRN: [id number] Visit Date: 02/13/2022 PCP: Kirstie Peri, MD   Assessment & Plan: Follow-up total knee arthroplasty on the right on 01/01/2022.  He still has 10 degree extension lag but passively comes to full extension.  Is flexing past 100 degrees nicely.  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  Chief Complaint:  Chief Complaint  Patient presents with   Right Knee - Follow-up, Routine Post Op    01/01/2022 Right TKA   Visit Diagnoses:  1. Status post total right knee replacement     Plan: 5 weeks continue quad strengthening.  Follow-Up Instructions: No follow-ups on file.   Orders:  No orders of the defined types were placed in this encounter.  No orders of the defined types were placed in this encounter.   Imaging: No results found.  PMFS History: Patient Active Problem List   Diagnosis Date Noted   Arthritis of right knee 01/01/2022   S/P total knee arthroplasty 09/12/2021   Unilateral primary osteoarthritis, right knee 09/12/2021   Past Medical History:  Diagnosis Date   Acid reflux    Anxiety    Arthritis    Coronary artery disease    Hypertension    Myocardial infarction [facility])    Osteoporosis    Pneumonia     No family history on file.  Past Surgical History:  Procedure Laterality Date   CARDIAC CATHETERIZATION     colonoscopy     CORONARY ANGIOPLASTY  04/27/2015   DES in LAD, provisional POBA to D1 ostium 04/27/15 [facility]   HIP FRACTURE SURGERY Left    pins placed   JOINT REPLACEMENT     TOTAL KNEE ARTHROPLASTY Left 07/29/2021   Procedure: LEFT TOTAL KNEE ARTHROPLASTY;  Surgeon: [name], MD;  Location: [facility];  Service: Orthopedics;  Laterality: Left;   TOTAL KNEE ARTHROPLASTY Right 01/01/2022   Procedure: RIGHT TOTAL KNEE ARTHROPLASTY;  Surgeon: [name], MD;  Location: [facility];  Service: Orthopedics;  Laterality: Right;   Social History    Occupational History   Not on file  Tobacco Use   Smoking status: Former    Types: Cigarettes    Quit date: 02/07/2020    Years since quitting: 2.0   Smokeless tobacco: Never  Vaping Use   Vaping Use: Never used  Substance and Sexual Activity   Alcohol use: No   Drug use: No   Sexual activity: Not on file

## 2022-02-24 ENCOUNTER — Encounter (HOSPITAL_BASED_OUTPATIENT_CLINIC_OR_DEPARTMENT_OTHER): Payer: Self-pay

## 2022-02-24 DIAGNOSIS — R0681 Apnea, not elsewhere classified: Secondary | ICD-10-CM

## 2022-02-27 DIAGNOSIS — R5383 Other fatigue: Secondary | ICD-10-CM | POA: Diagnosis not present

## 2022-02-27 DIAGNOSIS — Z299 Encounter for prophylactic measures, unspecified: Secondary | ICD-10-CM | POA: Diagnosis not present

## 2022-02-27 DIAGNOSIS — E7800 Pure hypercholesterolemia, unspecified: Secondary | ICD-10-CM | POA: Diagnosis not present

## 2022-02-27 DIAGNOSIS — I10 Essential (primary) hypertension: Secondary | ICD-10-CM | POA: Diagnosis not present

## 2022-02-27 DIAGNOSIS — Z0000 Encounter for general adult medical examination without abnormal findings: Secondary | ICD-10-CM | POA: Diagnosis not present

## 2022-02-27 DIAGNOSIS — Z79899 Other long term (current) drug therapy: Secondary | ICD-10-CM | POA: Diagnosis not present

## 2022-02-27 DIAGNOSIS — Z7189 Other specified counseling: Secondary | ICD-10-CM | POA: Diagnosis not present

## 2022-03-20 ENCOUNTER — Ambulatory Visit (INDEPENDENT_AMBULATORY_CARE_PROVIDER_SITE_OTHER): Payer: No Typology Code available for payment source | Admitting: Orthopaedic Surgery

## 2022-03-20 ENCOUNTER — Encounter: Payer: Self-pay | Admitting: Orthopaedic Surgery

## 2022-03-20 VITALS — Ht 70.0 in | Wt 173.0 lb

## 2022-03-20 DIAGNOSIS — Z96651 Presence of right artificial knee joint: Secondary | ICD-10-CM

## 2022-03-20 NOTE — Progress Notes (Signed)
   Post-Op Visit Note   Patient: Thomas [name]           Date of Birth: 06-11-1946           MRN: [id number] Visit Date: 03/20/2022 PCP: Monico Blitz, MD   Assessment & Plan: Follow-up right total knee arthroplasty 01/01/2022.  Full passive extension 5 degree extension lag flexion to 120.  Opposite left total knee from January does not have an extension lag.  Still has a hop when he goes up on a step.  Continue quad strengthening right and left.  Recheck 2 months.  Chief Complaint:  Chief Complaint  Patient presents with   Right Knee - Routine Post Op, Follow-up    01/01/2022 Right TKA   Visit Diagnoses:  1. Status post total right knee replacement     Plan: Recheck 2 months.  Follow-Up Instructions: Return in about 2 months (around 05/20/2022).   Orders:  No orders of the defined types were placed in this encounter.  No orders of the defined types were placed in this encounter.   Imaging: No results found.  PMFS History: Patient Active Problem List   Diagnosis Date Noted   Arthritis of right knee 01/01/2022   S/P total knee arthroplasty 09/12/2021   Unilateral primary osteoarthritis, right knee 09/12/2021   Past Medical History:  Diagnosis Date   Acid reflux    Anxiety    Arthritis    Coronary artery disease    Hypertension    Myocardial infarction [facility])    Osteoporosis    Pneumonia     No family history on file.  Past Surgical History:  Procedure Laterality Date   CARDIAC CATHETERIZATION     colonoscopy     CORONARY ANGIOPLASTY  04/27/2015   DES in LAD, provisional POBA to D1 ostium 04/27/15 [facility]   HIP FRACTURE SURGERY Left    pins placed   JOINT REPLACEMENT     TOTAL KNEE ARTHROPLASTY Left 07/29/2021   Procedure: LEFT TOTAL KNEE ARTHROPLASTY;  Surgeon: [name], MD;  Location: [place];  Service: Orthopedics;  Laterality: Left;   TOTAL KNEE ARTHROPLASTY Right 01/01/2022   Procedure: RIGHT TOTAL KNEE ARTHROPLASTY;  Surgeon: [name], MD;   Location: [place];  Service: Orthopedics;  Laterality: Right;   Social History   Occupational History   Not on file  Tobacco Use   Smoking status: Former    Types: Cigarettes    Quit date: 02/07/2020    Years since quitting: 2.1   Smokeless tobacco: Never  Vaping Use   Vaping Use: Never used  Substance and Sexual Activity   Alcohol use: No   Drug use: No   Sexual activity: Not on file

## 2022-04-14 ENCOUNTER — Ambulatory Visit: Payer: No Typology Code available for payment source | Attending: Family Medicine | Admitting: Neurology

## 2022-04-14 DIAGNOSIS — R0683 Snoring: Secondary | ICD-10-CM | POA: Diagnosis present

## 2022-04-14 DIAGNOSIS — R0681 Apnea, not elsewhere classified: Secondary | ICD-10-CM

## 2022-04-15 DIAGNOSIS — G4733 Obstructive sleep apnea (adult) (pediatric): Secondary | ICD-10-CM | POA: Diagnosis not present

## 2022-05-08 NOTE — Procedures (Signed)
[facility] Janesha Brissette A. Gerilyn Pilgrim, MD     [email or web address]             NOCTURNAL POLYSOMNOGRAPHY   LOCATION: [facility]   Patient Name: Thomas [name], Thomas [name] Date: 04/14/2022 Gender: Male D.O.B: Apr 08, 1946 Age (years): 35 Referring Provider: Gerhard Munch MD Height (inches): 70 Interpreting Physician: Beryle Beams MD, ABSM Weight (lbs): 175 RPSGT: Alfonso Ellis BMI: 25 MRN: [id number] Neck Size: 15.50 CLINICAL INFORMATION Sleep Study Type: NPSG     Indication for sleep study: N/A     Epworth Sleepiness Score: 2     SLEEP STUDY TECHNIQUE As per the AASM Manual for the Scoring of Sleep and Associated Events v2.3 (April 2016) with a hypopnea requiring 4% desaturations.  The channels recorded and monitored were frontal, central and occipital EEG, electrooculogram (EOG), submentalis EMG (chin), nasal and oral airflow, thoracic and abdominal wall motion, anterior tibialis EMG, snore microphone, electrocardiogram, and pulse oximetry.  MEDICATIONS Medications self-administered by patient taken the night of the study : N/A  Current Outpatient Medications:    acetaminophen (TYLENOL) 500 MG tablet, Take 1,000 mg by mouth every 8 (eight) hours as needed for moderate pain., Disp: , Rfl:    amoxicillin (AMOXIL) 500 MG tablet, 2 po 1 hour prior to procedure  and 2 po 6 hours after procedure, Disp: 4 tablet, Rfl: 0   aspirin EC 325 MG EC tablet, Take 1 tablet (325 mg total) by mouth daily with breakfast., Disp: 30 tablet, Rfl: 0   atorvastatin (LIPITOR) 80 MG tablet, Take 80 mg by mouth at bedtime., Disp: , Rfl:    Azelaic Acid 15 % gel, Apply 1 Application topically daily as needed (acne)., Disp: , Rfl:    calcipotriene (DOVONOX) 0.005 % cream, Apply 1 Application topically 2 (two) times daily as needed (psoriasis)., Disp: , Rfl:    chlorhexidine (HIBICLENS) 4 % external liquid, Apply 1 application topically 4 (four) times a week., Disp: , Rfl:    clindamycin  (CLEOCIN T) 1 % lotion, Apply 1 application  topically daily as needed (rash)., Disp: , Rfl:    clobetasol cream (TEMOVATE) 0.05 %, Apply 1 application topically 2 (two) times daily as needed (psoriasis)., Disp: , Rfl:    diclofenac Sodium (VOLTAREN) 1 % GEL, Apply 1 application topically 4 (four) times daily as needed (pain)., Disp: , Rfl:    HYDROcodone-acetaminophen (NORCO) 10-325 MG per tablet, Take 1 tablet by mouth 3 (three) times daily as needed for moderate pain., Disp: , Rfl:    methocarbamol (ROBAXIN) 500 MG tablet, Take 1 tablet (500 mg total) by mouth every 6 (six) hours as needed for muscle spasms., Disp: 40 tablet, Rfl: 0   metoprolol tartrate (LOPRESSOR) 25 MG tablet, Take 25 mg by mouth 2 (two) times daily., Disp: , Rfl:    nystatin (NYAMYC) powder, Apply 1 Application topically 3 (three) times daily as needed (itching)., Disp: , Rfl:    omeprazole (PRILOSEC) 20 MG capsule, Take 20 mg by mouth daily., Disp: , Rfl:    Salicylic Acid 6 % SHAM, Apply 1 application topically 4 (four) times a week., Disp: , Rfl:    sildenafil (VIAGRA) 50 MG tablet, Take 25 mg by mouth daily., Disp: , Rfl:    tamsulosin (FLOMAX) 0.4 MG CAPS capsule, Take 0.4 mg by mouth at bedtime., Disp: , Rfl:    tretinoin (RETIN-A) 0.1 % cream, Apply 1 application  topically daily as needed (acne)., Disp: , Rfl:      SLEEP ARCHITECTURE The study was  initiated at 10:20:34 PM and ended at 5:27:36 AM.  Sleep onset time was 11.3 minutes and the sleep efficiency was 73.5%. The total sleep time was 314 minutes.  Stage REM latency was 38.5 minutes.  The patient spent 6.53% of the night in stage N1 sleep, 48.41% in stage N2 sleep, 20.86% in stage N3 and 24.2% in REM.  Alpha intrusion was absent.  Supine sleep was 4.78%.  RESPIRATORY PARAMETERS The overall apnea/hypopnea index (AHI) was 3.4 per hour. There were 11 total apneas, including 8 obstructive, 3 central and 0 mixed apneas. There were 7 hypopneas and 0  RERAs.  The AHI during Stage REM sleep was 0.0 per hour.  AHI while supine was 72.0 per hour.  The mean oxygen saturation was 93.66%. The minimum SpO2 during sleep was 89.00%.  moderate snoring was noted during this study.  CARDIAC DATA The 2 lead EKG demonstrated sinus rhythm. The mean heart rate was 59.24 beats per minute. Other EKG findings include: None.  LEG MOVEMENT DATA The total PLMS were 0 with a resulting PLMS index of 0.00. Associated arousal with leg movement index was 0.0.  IMPRESSIONS   This is a normal recording.  No evidence of obstructive sleep apnea syndrome is noted.    Argie Ramming, MD Diplomate, American Board of Sleep Medicine.  ELECTRONICALLY SIGNED ON:  05/08/2022, 8:17 AM [facility] PH: [phone]   FX: [phone] ACCREDITED BY THE AMERICAN ACADEMY OF SLEEP MEDICINE

## 2022-08-28 ENCOUNTER — Encounter: Payer: Self-pay | Admitting: Radiology

## 2024-01-28 ENCOUNTER — Telehealth: Payer: Self-pay

## 2024-01-28 NOTE — Telephone Encounter (Signed)
 Faxed premed note to Dr Arron office, [place] at [phone]

## 2024-02-19 ENCOUNTER — Encounter: Payer: Self-pay | Admitting: Radiology

## 2024-05-02 ENCOUNTER — Encounter: Payer: Self-pay | Admitting: Radiology
